# Patient Record
Sex: Female | Born: 1935 | Race: Black or African American | Hispanic: No | Smoking: Never smoker
Health system: Southern US, Community
[De-identification: ages and names within clinical notes are randomized; demographics above are authoritative.]

## PROBLEM LIST (undated history)

## (undated) DIAGNOSIS — F0280 Dementia in other diseases classified elsewhere without behavioral disturbance: Secondary | ICD-10-CM

## (undated) DIAGNOSIS — E119 Type 2 diabetes mellitus without complications: Secondary | ICD-10-CM

## (undated) DIAGNOSIS — I10 Essential (primary) hypertension: Secondary | ICD-10-CM

## (undated) DIAGNOSIS — C50919 Malignant neoplasm of unspecified site of unspecified female breast: Secondary | ICD-10-CM

## (undated) HISTORY — PX: MASTECTOMY: SHX3

---

## 2020-08-27 ENCOUNTER — Other Ambulatory Visit: Payer: Self-pay

## 2020-08-27 ENCOUNTER — Inpatient Hospital Stay (HOSPITAL_BASED_OUTPATIENT_CLINIC_OR_DEPARTMENT_OTHER)
Admission: EM | Admit: 2020-08-27 | Discharge: 2020-08-29 | DRG: 065 | Disposition: A | Discharge status: Home Health | Payer: Medicare HMO | Attending: Internal Medicine | Admitting: Internal Medicine

## 2020-08-27 ENCOUNTER — Emergency Department (HOSPITAL_COMMUNITY): Payer: Medicare HMO

## 2020-08-27 ENCOUNTER — Inpatient Hospital Stay (HOSPITAL_COMMUNITY): Payer: Medicare HMO

## 2020-08-27 ENCOUNTER — Emergency Department (HOSPITAL_BASED_OUTPATIENT_CLINIC_OR_DEPARTMENT_OTHER): Payer: Medicare HMO

## 2020-08-27 ENCOUNTER — Encounter (HOSPITAL_BASED_OUTPATIENT_CLINIC_OR_DEPARTMENT_OTHER): Payer: Self-pay | Admitting: Emergency Medicine

## 2020-08-27 DIAGNOSIS — I6322 Cerebral infarction due to unspecified occlusion or stenosis of basilar arteries: Secondary | ICD-10-CM | POA: Diagnosis not present

## 2020-08-27 DIAGNOSIS — N289 Disorder of kidney and ureter, unspecified: Secondary | ICD-10-CM | POA: Diagnosis present

## 2020-08-27 DIAGNOSIS — Z9010 Acquired absence of unspecified breast and nipple: Secondary | ICD-10-CM

## 2020-08-27 DIAGNOSIS — E785 Hyperlipidemia, unspecified: Secondary | ICD-10-CM | POA: Diagnosis present

## 2020-08-27 DIAGNOSIS — I70208 Unspecified atherosclerosis of native arteries of extremities, other extremity: Secondary | ICD-10-CM | POA: Diagnosis present

## 2020-08-27 DIAGNOSIS — N179 Acute kidney failure, unspecified: Secondary | ICD-10-CM | POA: Diagnosis present

## 2020-08-27 DIAGNOSIS — Z20822 Contact with and (suspected) exposure to covid-19: Secondary | ICD-10-CM | POA: Diagnosis present

## 2020-08-27 DIAGNOSIS — N390 Urinary tract infection, site not specified: Secondary | ICD-10-CM | POA: Diagnosis present

## 2020-08-27 DIAGNOSIS — I69354 Hemiplegia and hemiparesis following cerebral infarction affecting left non-dominant side: Secondary | ICD-10-CM

## 2020-08-27 DIAGNOSIS — I700 Atherosclerosis of aorta: Secondary | ICD-10-CM | POA: Diagnosis present

## 2020-08-27 DIAGNOSIS — R29701 NIHSS score 1: Secondary | ICD-10-CM | POA: Diagnosis present

## 2020-08-27 DIAGNOSIS — F0280 Dementia in other diseases classified elsewhere without behavioral disturbance: Secondary | ICD-10-CM | POA: Diagnosis not present

## 2020-08-27 DIAGNOSIS — R531 Weakness: Secondary | ICD-10-CM

## 2020-08-27 DIAGNOSIS — Z8249 Family history of ischemic heart disease and other diseases of the circulatory system: Secondary | ICD-10-CM

## 2020-08-27 DIAGNOSIS — Z79899 Other long term (current) drug therapy: Secondary | ICD-10-CM

## 2020-08-27 DIAGNOSIS — R278 Other lack of coordination: Secondary | ICD-10-CM | POA: Diagnosis present

## 2020-08-27 DIAGNOSIS — Z7984 Long term (current) use of oral hypoglycemic drugs: Secondary | ICD-10-CM

## 2020-08-27 DIAGNOSIS — I639 Cerebral infarction, unspecified: Secondary | ICD-10-CM | POA: Diagnosis present

## 2020-08-27 DIAGNOSIS — Z853 Personal history of malignant neoplasm of breast: Secondary | ICD-10-CM | POA: Diagnosis not present

## 2020-08-27 DIAGNOSIS — E119 Type 2 diabetes mellitus without complications: Secondary | ICD-10-CM

## 2020-08-27 DIAGNOSIS — I6389 Other cerebral infarction: Secondary | ICD-10-CM | POA: Diagnosis not present

## 2020-08-27 DIAGNOSIS — R29810 Facial weakness: Secondary | ICD-10-CM | POA: Diagnosis present

## 2020-08-27 DIAGNOSIS — E1122 Type 2 diabetes mellitus with diabetic chronic kidney disease: Secondary | ICD-10-CM | POA: Diagnosis not present

## 2020-08-27 DIAGNOSIS — G309 Alzheimer's disease, unspecified: Secondary | ICD-10-CM | POA: Diagnosis present

## 2020-08-27 DIAGNOSIS — I351 Nonrheumatic aortic (valve) insufficiency: Secondary | ICD-10-CM | POA: Diagnosis not present

## 2020-08-27 DIAGNOSIS — E1151 Type 2 diabetes mellitus with diabetic peripheral angiopathy without gangrene: Secondary | ICD-10-CM | POA: Diagnosis present

## 2020-08-27 DIAGNOSIS — I6381 Other cerebral infarction due to occlusion or stenosis of small artery: Secondary | ICD-10-CM | POA: Diagnosis not present

## 2020-08-27 DIAGNOSIS — I10 Essential (primary) hypertension: Secondary | ICD-10-CM | POA: Diagnosis present

## 2020-08-27 HISTORY — DX: Dementia in other diseases classified elsewhere, unspecified severity, without behavioral disturbance, psychotic disturbance, mood disturbance, and anxiety: F02.80

## 2020-08-27 HISTORY — DX: Essential (primary) hypertension: I10

## 2020-08-27 HISTORY — DX: Cerebral infarction, unspecified: I63.9

## 2020-08-27 HISTORY — DX: Type 2 diabetes mellitus without complications: E11.9

## 2020-08-27 HISTORY — DX: Malignant neoplasm of unspecified site of unspecified female breast: C50.919

## 2020-08-27 LAB — CBC
HCT: 37.1 % (ref 36.0–46.0)
Hemoglobin: 11.2 g/dL — ABNORMAL LOW (ref 12.0–15.0)
MCH: 23.3 pg — ABNORMAL LOW (ref 26.0–34.0)
MCHC: 30.2 g/dL (ref 30.0–36.0)
MCV: 77.1 fL — ABNORMAL LOW (ref 80.0–100.0)
Platelets: 244 10*3/uL (ref 150–400)
RBC: 4.81 MIL/uL (ref 3.87–5.11)
RDW: 15.1 % (ref 11.5–15.5)
WBC: 4 10*3/uL (ref 4.0–10.5)
nRBC: 0 % (ref 0.0–0.2)

## 2020-08-27 LAB — DIFFERENTIAL
Abs Immature Granulocytes: 0.01 10*3/uL (ref 0.00–0.07)
Basophils Absolute: 0 10*3/uL (ref 0.0–0.1)
Basophils Relative: 1 %
Eosinophils Absolute: 0 10*3/uL (ref 0.0–0.5)
Eosinophils Relative: 1 %
Immature Granulocytes: 0 %
Lymphocytes Relative: 23 %
Lymphs Abs: 0.9 10*3/uL (ref 0.7–4.0)
Monocytes Absolute: 0.2 10*3/uL (ref 0.1–1.0)
Monocytes Relative: 6 %
Neutro Abs: 2.8 10*3/uL (ref 1.7–7.7)
Neutrophils Relative %: 69 %

## 2020-08-27 LAB — URINALYSIS, ROUTINE W REFLEX MICROSCOPIC
Bilirubin Urine: NEGATIVE
Glucose, UA: 250 mg/dL — AB
Hgb urine dipstick: NEGATIVE
Ketones, ur: NEGATIVE mg/dL
Nitrite: NEGATIVE
Protein, ur: NEGATIVE mg/dL
Specific Gravity, Urine: 1.025 (ref 1.005–1.030)
pH: 5.5 (ref 5.0–8.0)

## 2020-08-27 LAB — RAPID URINE DRUG SCREEN, HOSP PERFORMED
Amphetamines: NOT DETECTED
Barbiturates: NOT DETECTED
Benzodiazepines: NOT DETECTED
Cocaine: NOT DETECTED
Opiates: NOT DETECTED
Tetrahydrocannabinol: NOT DETECTED

## 2020-08-27 LAB — COMPREHENSIVE METABOLIC PANEL
ALT: 11 U/L (ref 0–44)
AST: 17 U/L (ref 15–41)
Albumin: 3.8 g/dL (ref 3.5–5.0)
Alkaline Phosphatase: 77 U/L (ref 38–126)
Anion gap: 9 (ref 5–15)
BUN: 16 mg/dL (ref 8–23)
CO2: 25 mmol/L (ref 22–32)
Calcium: 9 mg/dL (ref 8.9–10.3)
Chloride: 102 mmol/L (ref 98–111)
Creatinine, Ser: 1.29 mg/dL — ABNORMAL HIGH (ref 0.44–1.00)
GFR calc Af Amer: 44 mL/min — ABNORMAL LOW (ref >60)
GFR calc non Af Amer: 38 mL/min — ABNORMAL LOW (ref >60)
Glucose, Bld: 228 mg/dL — ABNORMAL HIGH (ref 70–99)
Potassium: 4.7 mmol/L (ref 3.5–5.1)
Sodium: 136 mmol/L (ref 135–145)
Total Bilirubin: 0.8 mg/dL (ref 0.3–1.2)
Total Protein: 7.3 g/dL (ref 6.5–8.1)

## 2020-08-27 LAB — URINALYSIS, MICROSCOPIC (REFLEX)

## 2020-08-27 LAB — PROTIME-INR
INR: 1 (ref 0.8–1.2)
Prothrombin Time: 13.1 seconds (ref 11.4–15.2)

## 2020-08-27 LAB — CBG MONITORING, ED
Glucose-Capillary: 140 mg/dL — ABNORMAL HIGH (ref 70–99)
Glucose-Capillary: 215 mg/dL — ABNORMAL HIGH (ref 70–99)
Glucose-Capillary: 239 mg/dL — ABNORMAL HIGH (ref 70–99)

## 2020-08-27 LAB — SARS CORONAVIRUS 2 BY RT PCR (HOSPITAL ORDER, PERFORMED IN ~~LOC~~ HOSPITAL LAB): SARS Coronavirus 2: NEGATIVE

## 2020-08-27 LAB — ETHANOL: Alcohol, Ethyl (B): <10 mg/dL (ref <10)

## 2020-08-27 LAB — APTT: aPTT: 28 seconds (ref 24–36)

## 2020-08-27 MED ORDER — INSULIN ASPART 100 UNIT/ML ~~LOC~~ SOLN
0.0000 [IU] | SUBCUTANEOUS | Status: DC
  Administered 2020-08-27: 3 [IU] via SUBCUTANEOUS
  Administered 2020-08-27 – 2020-08-28 (×2): 1 [IU] via SUBCUTANEOUS
  Administered 2020-08-28: 2 [IU] via SUBCUTANEOUS
  Administered 2020-08-28: 1 [IU] via SUBCUTANEOUS
  Administered 2020-08-28: 2 [IU] via SUBCUTANEOUS
  Administered 2020-08-29: 1 [IU] via SUBCUTANEOUS
  Administered 2020-08-29: 2 [IU] via SUBCUTANEOUS
  Administered 2020-08-29: 1 [IU] via SUBCUTANEOUS
  Administered 2020-08-29: 2 [IU] via SUBCUTANEOUS

## 2020-08-27 MED ORDER — ACETAMINOPHEN 325 MG PO TABS: 650.0000 mg | ORAL_TABLET | ORAL | Status: DC | PRN

## 2020-08-27 MED ORDER — CEPHALEXIN 250 MG PO CAPS
500.0000 mg | ORAL_CAPSULE | Freq: Once | ORAL | Status: AC
  Administered 2020-08-27: 500 mg via ORAL
  Filled 2020-08-27: qty 2

## 2020-08-27 MED ORDER — STROKE: EARLY STAGES OF RECOVERY BOOK: Freq: Once | Status: AC

## 2020-08-27 MED ORDER — LORAZEPAM 2 MG/ML IJ SOLN
0.5000 mg | Freq: Once | INTRAMUSCULAR | Status: AC | PRN
  Administered 2020-08-27: 0.5 mg via INTRAVENOUS
  Filled 2020-08-27: qty 1

## 2020-08-27 MED ORDER — ACETAMINOPHEN 160 MG/5ML PO SOLN: 650.0000 mg | ORAL | Status: DC | PRN

## 2020-08-27 MED ORDER — ACETAMINOPHEN 650 MG RE SUPP: 650.0000 mg | RECTAL | Status: DC | PRN

## 2020-08-27 MED ORDER — ASPIRIN 300 MG RE SUPP: 300.0000 mg | Freq: Every day | RECTAL | Status: DC

## 2020-08-27 MED ORDER — ASPIRIN 325 MG PO TABS
325.0000 mg | ORAL_TABLET | Freq: Every day | ORAL | Status: DC
  Administered 2020-08-27 – 2020-08-28 (×2): 325 mg via ORAL
  Filled 2020-08-27 (×2): qty 1

## 2020-08-27 MED ORDER — SODIUM CHLORIDE 0.9 % IV SOLN: INTRAVENOUS | Status: DC

## 2020-08-27 NOTE — ED Provider Notes (Signed)
[name] Provider Note   CSN: [id number] Arrival date & time: 08/27/20  1056     History Chief Complaint  Patient presents with  . Facial Droop    Rita Douglas is a 86 y.o. female.  HPI      86yo female with history of dementia, DM, htn, breast cancer, presents with concern for left facial droop, left leg dragging.  Symptoms began 2 days ago and has stayed the same. Normally walks independently in 2 inch heels but having difficulty walking today. NO other concerns in terms of fall, fever, cough, congestion. Patient states she is not sure why she is here. History taken from daughter. She states face appears asymmetric, voice thick.  No other concerns.   Past Medical History:  Diagnosis Date  . Alzheimer's dementia ([place])   . Breast cancer ([place])   . Diabetes mellitus without complication ([place])   . Hypertension     Patient Active Problem List   Diagnosis Date Noted  . CVA (cerebral vascular accident) ([place]) 08/27/2020  . DM (diabetes mellitus), type 2 ([place]) 08/27/2020  . Essential hypertension 08/27/2020    Past Surgical History:  Procedure Laterality Date  . MASTECTOMY       OB History   No obstetric history on file.     No family history on file.  Social History   Tobacco Use  . Smoking status: Never Smoker  . Smokeless tobacco: Never Used  Substance Use Topics  . Alcohol use: Not on file  . Drug use: Not on file    Home Medications Prior to Admission medications   Medication Sig Start Date End Date Taking? Authorizing Provider  lisinopril (ZESTRIL) 10 MG tablet Take 10 mg by mouth daily. 08/27/20  Yes [provider]  metFORMIN (GLUCOPHAGE) 1000 MG tablet Take 1,000 mg by mouth 2 (two) times daily with a meal.  06/19/20  Yes [provider]    Allergies    Patient has no known allergies.  Review of Systems   Review of Systems  Constitutional: Negative for fever.  HENT: Negative for sore throat.    Eyes: Negative for visual disturbance.  Respiratory: Negative for cough and shortness of breath.   Cardiovascular: Negative for chest pain.  Gastrointestinal: Negative for abdominal pain, constipation, diarrhea, nausea and vomiting.  Genitourinary: Negative for difficulty urinating.  Musculoskeletal: Positive for gait problem. Negative for back pain and neck pain.  Skin: Negative for rash.  Neurological: Positive for facial asymmetry and weakness. Negative for dizziness, syncope, speech difficulty, numbness and headaches.    Physical Exam Updated Vital Signs BP (!) 176/96 (BP Location: Right Arm)   Pulse 64   Temp (!) 97.4 F (36.3 C) (Oral)   Resp 16   Ht 5\' 4"  (1.626 m)   Wt 58.1 kg   SpO2 100%   BMI 21.97 kg/m   Physical Exam Vitals and nursing note reviewed.  Constitutional:      General: She is not in acute distress.    Appearance: She is well-developed. She is not diaphoretic.  HENT:     Head: Normocephalic and atraumatic.  Eyes:     Conjunctiva/sclera: Conjunctivae normal.  Cardiovascular:     Rate and Rhythm: Normal rate and regular rhythm.     Heart sounds: Normal heart sounds. No murmur heard.  No friction rub. No gallop.   Pulmonary:     Effort: Pulmonary effort is normal. No respiratory distress.     Breath sounds: Normal breath  sounds. No wheezing or rales.  Abdominal:     General: There is no distension.     Palpations: Abdomen is soft.     Tenderness: There is no abdominal tenderness. There is no guarding.  Musculoskeletal:        General: No tenderness.     Cervical back: Normal range of motion.  Skin:    General: Skin is warm and dry.     Findings: No erythema or rash.  Neurological:     Mental Status: She is alert and oriented to person, place, and time.     Comments: Slight drift left arm, slight facial asymmetry with strength bilateral face however family reports left side drooping more than baseline, slight dysmetria vs weakness LUE, LLE and  RLE appears normal on exam.  Oriented to self, "[place]" (at [facility]), not aware of date or president     ED Results / Procedures / Treatments   Labs (all labs ordered are listed, but only abnormal results are displayed) Labs Reviewed  CBC - Abnormal; Notable for the following components:      Result Value   Hemoglobin 11.2 (*)    MCV 77.1 (*)    MCH 23.3 (*)    All other components within normal limits  COMPREHENSIVE METABOLIC PANEL - Abnormal; Notable for the following components:   Glucose, Bld 228 (*)    Creatinine, Ser 1.29 (*)    GFR calc non Af Amer 38 (*)    GFR calc Af Amer 44 (*)    All other components within normal limits  URINALYSIS, ROUTINE W REFLEX MICROSCOPIC - Abnormal; Notable for the following components:   Glucose, UA 250 (*)    Leukocytes,Ua SMALL (*)    All other components within normal limits  URINALYSIS, MICROSCOPIC (REFLEX) - Abnormal; Notable for the following components:   Bacteria, UA MANY (*)    All other components within normal limits  CBG MONITORING, ED - Abnormal; Notable for the following components:   Glucose-Capillary 215 (*)    All other components within normal limits  CBG MONITORING, ED - Abnormal; Notable for the following components:   Glucose-Capillary 239 (*)    All other components within normal limits  SARS CORONAVIRUS 2 BY RT PCR (HOSPITAL ORDER, [facility])  URINE CULTURE  PROTIME-INR  DIFFERENTIAL  ETHANOL  APTT  RAPID URINE DRUG SCREEN, HOSP PERFORMED  HEMOGLOBIN A1C  LIPID PANEL  HEMOGLOBIN A1C  LIPID PANEL  URINALYSIS, DIPSTICK ONLY    EKG EKG Interpretation  Date/Time:  Monday August 27 2020 11:32:20 EDT Ventricular Rate:  69 PR Interval:    QRS Duration: 91 QT Interval:  379 QTC Calculation: 406 R Axis:   -5 Text Interpretation: Sinus rhythm Consider left atrial enlargement Borderline T abnormalities, inferior leads No old tracing to compare Confirmed by Isla Pence  [phone]) on 08/27/2020 3:10:43 PM Also confirmed by Gareth Morgan [phone])  on 08/27/2020 9:13:31 PM   Radiology CT HEAD WO CONTRAST  Result Date: 08/27/2020 CLINICAL DATA:  Left-sided facial drooping and dragging left leg. Suspect stroke. EXAM: CT HEAD WITHOUT CONTRAST TECHNIQUE: Contiguous axial images were obtained from the base of the skull through the vertex without intravenous contrast. COMPARISON:  09/02/2016 FINDINGS: Brain: Mild cerebral atrophy is similar to the previous examination. No evidence for acute hemorrhage, mass lesion, midline shift, hydrocephalus or large infarct. Vascular: No hyperdense vessel or unexpected calcification. Skull: Normal. Negative for fracture or focal lesion. Sinuses/Orbits: No acute finding. Other:  None. IMPRESSION: 1. No acute intracranial abnormality. 2. Stable mild cerebral atrophy. Electronically Signed   By: Markus Daft M.D.   On: 08/27/2020 11:25   MR BRAIN WO CONTRAST  Result Date: 08/27/2020 CLINICAL DATA:  Stroke, follow-up. EXAM: MRI HEAD WITHOUT CONTRAST TECHNIQUE: Multiplanar, multiecho pulse sequences of the brain and surrounding structures were obtained without intravenous contrast. COMPARISON:  Noncontrast head CT 08/27/2020 FINDINGS: Brain: Moderate generalized parenchymal atrophy. There is a focus of restricted diffusion consistent with acute infarction within the right pons measuring 1.9 x 1.1 x 1.3 cm (series 3, image 18) (series 4, image 19). Corresponding T2/FLAIR hyperintensity at this site. Moderate patchy T2 hyperintensity within the pons consistent with chronic small vessel ischemic changes. Minimal multifocal T2/FLAIR hyperintensity within the cerebral white matter, nonspecific but also likely reflecting chronic small vessel ischemic disease. Small chronic right basal ganglia lacunar infarct (series 7, image 13). No evidence of intracranial mass. No chronic intracranial blood products. No extra-axial fluid collection. No midline shift.  Vascular: Expected proximal arterial flow voids. Skull and upper cervical spine: No focal marrow lesion. Sinuses/Orbits: Visualized orbits show no acute finding. Minimal ethmoid sinus mucosal thickening. No significant mastoid effusion IMPRESSION: Acute infarct within the right pons measuring 1.9 x 1.1 cm in transaxial dimensions. Background moderate chronic small vessel ischemic changes within the pons and mild chronic small vessel ischemic changes within the cerebral white matter. Small chronic right basal ganglia lacunar infarct. Moderate generalized parenchymal atrophy. Electronically Signed   By: Kellie Simmering DO   On: 08/27/2020 18:51    Procedures Procedures (including critical care time)  Medications Ordered in ED Medications  insulin aspart (novoLOG) injection 0-9 Units (has no administration in time range)   stroke: mapping our early stages of recovery book (has no administration in time range)  0.9 %  sodium chloride infusion (has no administration in time range)  acetaminophen (TYLENOL) tablet 650 mg (has no administration in time range)    Or  acetaminophen (TYLENOL) 160 MG/5ML solution 650 mg (has no administration in time range)    Or  acetaminophen (TYLENOL) suppository 650 mg (has no administration in time range)  aspirin suppository 300 mg (has no administration in time range)    Or  aspirin tablet 325 mg (has no administration in time range)  cephALEXin (KEFLEX) capsule 500 mg (500 mg Oral Given 08/27/20 1403)  LORazepam (ATIVAN) injection 0.5 mg (0.5 mg Intravenous Given 08/27/20 1406)    ED Course  I have reviewed the triage vital signs and the nursing notes.  Pertinent labs & imaging results that were available during my care of the patient were reviewed by me and considered in my medical decision making (see chart for details).    MDM Rules/Calculators/A&P                          86yo female with history of dementia, DM, htn, breast cancer, presents with concern  for left facial droop, left leg dragging.  History concerning for CVA 2 days ago, however exam with very mild abnormalities and in current climate would like to confirm possible CVA on MRI prior to admission.  Transferred to [facility] for MRI for further evaluation.  Given keflex for possible UTI.  Transferred for further care.   Final Clinical Impression(s) / ED Diagnoses Final diagnoses:  Stroke (cerebrum) ([place])  Left-sided weakness    Rx / DC Orders ED Discharge Orders    None  Gareth Morgan, MD 08/27/20 2128

## 2020-08-27 NOTE — ED Notes (Signed)
Pt transported to X-Ray by stretcher.

## 2020-08-27 NOTE — ED Triage Notes (Signed)
Family states they have noticed left sided facial drooping and dragging her left leg x 2 days.  Unsure if patient is taking her medications.

## 2020-08-27 NOTE — ED Triage Notes (Signed)
Pt transferred from [place] for MRI after she lost her dentures and her daughter "made" her go to hosp.  Pt states she always has a facial droop when she doesn't have her dentures in, but daughter does not agree.  NIH of 1 at [facility].

## 2020-08-27 NOTE — ED Notes (Signed)
Pt. explaines she has white coat syndrome and her B/P goes up when she is at Dr. Gabriel Carina or hospital

## 2020-08-27 NOTE — ED Provider Notes (Signed)
Pt transferred from [facility] for MRI brain.  She has no complaints.  She remains stable.     MRI:  IMPRESSION: Acute infarct within the right pons measuring 1.9 x 1.1 cm in transaxial dimensions.  Background moderate chronic small vessel ischemic changes within the pons and mild chronic small vessel ischemic changes within the cerebral white matter.  Small chronic right basal ganglia lacunar infarct.  Moderate generalized parenchymal atrophy.  Pt d/w Dr. Roel Cluck (triad) for admission.  Pt d/w Dr. Cheral Marker (neurology) who will see patient.  CRITICAL CARE Performed by: Isla Pence   Total critical care time: 30 minutes  Critical care time was exclusive of separately billable procedures and treating other patients.  Critical care was necessary to treat or prevent imminent or life-threatening deterioration.  Critical care was time spent personally by me on the following activities: development of treatment plan with patient and/or surrogate as well as nursing, discussions with consultants, evaluation of patient's response to treatment, examination of patient, obtaining history from patient or surrogate, ordering and performing treatments and interventions, ordering and review of laboratory studies, ordering and review of radiographic studies, pulse oximetry and re-evaluation of patient's condition.    Isla Pence, MD 08/27/20 2008

## 2020-08-27 NOTE — Consult Note (Signed)
NEURO HOSPITALIST CONSULT NOTE   Requestig physician: Dr. Roel Cluck  Reason for Consult: Acute right paramedian pontine stroke  History obtained from:  Patient and Chart     HPI:                                                                                                                                          Rita Douglas is an 86 y.o. female with a history of DM, HTN, breast cancer and Alzheimer's dementia who initially presented to [facility] with a 2 day history of left facial droop and LLE weakness manifesting as dragging of her left leg while ambulating. Exam by the EDP revealed slight drift of her LUE, mild dysmetria of her LUE and LLE and slight facial asymmetry. She was transferred to the [facility] ED for an MRI, which revealed an acute right paramedian pontine ischemic infarction.   She walks independently at baseline.   Past Medical History:  Diagnosis Date  . Alzheimer's dementia ([place])   . Breast cancer ([place])   . Diabetes mellitus without complication ([place])   . Hypertension     Past Surgical History:  Procedure Laterality Date  . MASTECTOMY      No family history on file.           Social History:  reports that she has never smoked. She has never used smokeless tobacco. No history on file for alcohol use and drug use.  No Known Allergies  HOME MEDICATIONS:                                                                                                                      No current facility-administered medications on file prior to encounter.   Current Outpatient Medications on File Prior to Encounter  Medication Sig Dispense Refill  . lisinopril (ZESTRIL) 10 MG tablet Take 10 mg by mouth daily.    . metFORMIN (GLUCOPHAGE) 1000 MG tablet Take 1,000 mg by mouth 2 (two) times daily with a meal.        ROS:  As per HPI.  Comprehensive ROS otherwise negative.    Blood pressure (!) 158/104, pulse 85, temperature (!) 97.4 F (36.3 C), temperature source Oral, resp. rate 15, height 5\' 4"  (1.626 m), weight 58.1 kg, SpO2 100 %.   General Examination:                                                                                                       Physical Exam  HEENT-  [place]/AT   Lungs- Respirations unlabored Extremities- No edema   Neurological Examination Mental Status: Awake and alert. Pleasant and cooperative. Oriented to the city and state, but not day, month or year. Speech is fluent with intact comprehension. Prosody intact. No dysarthria.   Cranial Nerves: II: Visual fields grossly intact with no extinction to DSS. PERRL.   III,IV, VI: No ptosis. EOMI. No nystagmus.   V,VII: Smile symmetric with no facial droop noted. Intermittent oral dyskinesia is noted. Facial temp sensation intact bilaterally VIII: hearing intact to voice IX,X: No hypophonia XI:  Slight lag on the left with shoulder shrug XII: Midline tongue extension Motor: RUE and RLE 5/5 LUE 4+/5 LLE 4+/5 No pronator drift noted.  Sensory: Temp and light touch intact and symmetric throughout, bilaterally. No extinction to DSS.  Deep Tendon Reflexes: 1+ right brachioradialis and biceps. Trace left brachioradialis and biceps. Trace right patellar, 0 left patellar.  Plantars: Right: downgoing   Left: downgoing Cerebellar: Mild ataxia with left FNF and H-S. Normal on the right.  Gait: Deferred   Lab Results: Basic Metabolic Panel: Recent Labs  Lab 08/27/20 1228  NA 136  K 4.7  CL 102  CO2 25  GLUCOSE 228*  BUN 16  CREATININE 1.29*  CALCIUM 9.0    CBC: Recent Labs  Lab 08/27/20 1228  WBC 4.0  NEUTROABS 2.8  HGB 11.2*  HCT 37.1  MCV 77.1*  PLT 244    Cardiac Enzymes: No results for input(s): CKTOTAL, CKMB, CKMBINDEX, TROPONINI in the last 168 hours.  Lipid Panel: No results for input(s): CHOL, TRIG, HDL,  CHOLHDL, VLDL, LDLCALC in the last 168 hours.  Imaging: DG Chest 2 View  Result Date: 08/27/2020 CLINICAL DATA:  Facial droop. EXAM: CHEST - 2 VIEW COMPARISON:  September 02, 2016 FINDINGS: Mildly decreased lung volumes are seen which is likely secondary to the degree of patient inspiration. There is no evidence of acute infiltrate, pleural effusion or pneumothorax. There is mild to moderate severity enlargement of the cardiac silhouette. Marked severity calcification of the aortic arch is seen. Moderate severity degenerative changes seen involving the left shoulder. IMPRESSION: No acute or active cardiopulmonary disease. Electronically Signed   By: Virgina Norfolk M.D.   On: 08/27/2020 21:25   CT HEAD WO CONTRAST  Result Date: 08/27/2020 CLINICAL DATA:  Left-sided facial drooping and dragging left leg. Suspect stroke. EXAM: CT HEAD WITHOUT CONTRAST TECHNIQUE: Contiguous axial images were obtained from the base of the skull through the vertex without intravenous contrast. COMPARISON:  09/02/2016 FINDINGS: Brain: Mild cerebral atrophy is similar to the previous examination. No evidence for acute hemorrhage,  mass lesion, midline shift, hydrocephalus or large infarct. Vascular: No hyperdense vessel or unexpected calcification. Skull: Normal. Negative for fracture or focal lesion. Sinuses/Orbits: No acute finding. Other: None. IMPRESSION: 1. No acute intracranial abnormality. 2. Stable mild cerebral atrophy. Electronically Signed   By: Markus Daft M.D.   On: 08/27/2020 11:25   MR BRAIN WO CONTRAST  Result Date: 08/27/2020 CLINICAL DATA:  Stroke, follow-up. EXAM: MRI HEAD WITHOUT CONTRAST TECHNIQUE: Multiplanar, multiecho pulse sequences of the brain and surrounding structures were obtained without intravenous contrast. COMPARISON:  Noncontrast head CT 08/27/2020 FINDINGS: Brain: Moderate generalized parenchymal atrophy. There is a focus of restricted diffusion consistent with acute infarction within the  right pons measuring 1.9 x 1.1 x 1.3 cm (series 3, image 18) (series 4, image 19). Corresponding T2/FLAIR hyperintensity at this site. Moderate patchy T2 hyperintensity within the pons consistent with chronic small vessel ischemic changes. Minimal multifocal T2/FLAIR hyperintensity within the cerebral white matter, nonspecific but also likely reflecting chronic small vessel ischemic disease. Small chronic right basal ganglia lacunar infarct (series 7, image 13). No evidence of intracranial mass. No chronic intracranial blood products. No extra-axial fluid collection. No midline shift. Vascular: Expected proximal arterial flow voids. Skull and upper cervical spine: No focal marrow lesion. Sinuses/Orbits: Visualized orbits show no acute finding. Minimal ethmoid sinus mucosal thickening. No significant mastoid effusion IMPRESSION: Acute infarct within the right pons measuring 1.9 x 1.1 cm in transaxial dimensions. Background moderate chronic small vessel ischemic changes within the pons and mild chronic small vessel ischemic changes within the cerebral white matter. Small chronic right basal ganglia lacunar infarct. Moderate generalized parenchymal atrophy. Electronically Signed   By: Kellie Simmering DO   On: 08/27/2020 18:51    Assessment: 86 year old female with acute right paramedian pontine ischemic infarction. 1. Exam reveals mild left sided ataxia and subtle LUE and LLE weakness.  2. MRI brain reveals an acute infarct within the right pons measuring 1.9 x 1.1 cm in transaxial dimensions. There are moderate chronic small vessel ischemic changes within the pons and mild chronic small vessel ischemic changes within the cerebral white matter. A small chronic right basal ganglia lacunar infarct is also noted. Moderate generalized parenchymal atrophy is present. 3. Stroke risk factors: DM and HTN  Recommendations: 1. HgbA1c, fasting lipid panel 2. MRA of the brain without contrast 3. Carotid ultrasound 4.  TTE 5. Benefits of statin therapy may be outweighed by risks given the patient's advanced age 84. Prophylactic therapy- Has been started on ASA 7. Risk factor modification 8. Telemetry monitoring 9. Frequent neuro checks 10. NPO until passes stroke swallow screen 11. BP management. Out of permissive HTN time window.     Electronically signed: Dr. Kerney Elbe  08/27/2020, 9:58 PM

## 2020-08-27 NOTE — ED Notes (Signed)
The Pt. Explains to RN she has no facial droop on the L side.  Pt. Said her teeth keep her face from looking that way and her daughter thinks she has this going on.

## 2020-08-27 NOTE — H&P (Addendum)
Rita Douglas HEN:[id number] DOB: 04-06-1934 DOA: 08/27/2020     PCP: Iva Lento, PA-C   Outpatient Specialists:   NONE   Patient arrived to ER on 08/27/20 at 1056 Referred by Attending Isla Pence, MD   Patient coming from: home    Chief Complaint:  Chief Complaint  Patient presents with   Facial Droop    HPI: Rita Douglas is a 86 y.o. female with medical history significant of Dm2, HTN history of breast cancer    Presented with left facial droop and dragging her left leg for the past 2 days patient states is since she took out her dentures and she always walks with dura does not think that.  Her daughter took her to [place].  From there she was transferred to [facility], ER for possible CVA work-up.  Patient has dementia at baseline but stable she has diabetes on p.o. medications At her baseline patient walks independently but have been having some difficulty walking today. No prior history of CVA  Infectious risk factors:  Reports none    Initial COVID TEST  NEGATIVE   Lab Results  Component Value Date   [place] NEGATIVE 08/27/2020     Regarding pertinent Chronic problems:     HTN on lisinopril  DM 2 - on PO meds only,   Dementia - not on any meds   Chronic anemia - baseline hg Hemoglobin & Hematocrit  Recent Labs    08/27/20 1228  HGB 11.2*    While in ER:  MRI showing pontine CVA  ER Provider Called:  Neurology   Dr.LIndzen They Recommend admit to medicine  Will see    in ER  Hospitalist was called for admission for  CVA  The following Work up has been ordered so far:  Orders Placed This Encounter  Procedures   SARS Coronavirus 2 by RT PCR (hospital order, performed in [facility] hospital lab) Nasopharyngeal Nasopharyngeal Swab   CT HEAD WO CONTRAST   MR BRAIN WO CONTRAST   Protime-INR   CBC   Differential   Comprehensive metabolic panel   Ethanol   APTT   Urine rapid drug screen (hosp performed)    Urinalysis, Routine w reflex microscopic   Urinalysis, Microscopic (reflex)   Diet heart healthy/carb modified Room service appropriate? Yes; Fluid consistency: Thin   Cardiac monitoring   Stroke swallow screen   NIH Stroke Scale   Modified Stroke Scale (mNIHSS) Document mNIHSS assessment every 2 hours for a total of 12 hours   Saline Lock IV, Maintain IV access   If O2 Sat <94% administer O2 at 2 liters/minute via nasal cannula   Vital signs   Initiate [place] Fluid Protocol   Consult to neurology  ALL PATIENTS BEING ADMITTED/HAVING PROCEDURES NEED COVID-19 SCREENING   Consult for Unassigned Medical Admission  ALL PATIENTS BEING ADMITTED/HAVING PROCEDURES NEED COVID-19 SCREENING   Pulse oximetry, continuous   CBG monitoring, ED   ED EKG   Saline lock IV   Following Medications were ordered in ER: Medications  cephALEXin (KEFLEX) capsule 500 mg (500 mg Oral Given 08/27/20 1403)  LORazepam (ATIVAN) injection 0.5 mg (0.5 mg Intravenous Given 08/27/20 1406)       Significant initial  Findings: Abnormal Labs Reviewed  CBC - Abnormal; Notable for the following components:      Result Value   Hemoglobin 11.2 (*)    MCV 77.1 (*)    MCH 23.3 (*)    All other components  within normal limits  COMPREHENSIVE METABOLIC PANEL - Abnormal; Notable for the following components:   Glucose, Bld 228 (*)    Creatinine, Ser 1.29 (*)    GFR calc non Af Amer 38 (*)    GFR calc Af Amer 44 (*)    All other components within normal limits  URINALYSIS, ROUTINE W REFLEX MICROSCOPIC - Abnormal; Notable for the following components:   Glucose, UA 250 (*)    Leukocytes,Ua SMALL (*)    All other components within normal limits  URINALYSIS, MICROSCOPIC (REFLEX) - Abnormal; Notable for the following components:   Bacteria, UA MANY (*)    All other components within normal limits  CBG MONITORING, ED - Abnormal; Notable for the following components:   Glucose-Capillary 215 (*)    All other  components within normal limits    Otherwise labs showing:    Recent Labs  Lab 08/27/20 1228  NA 136  K 4.7  CO2 25  GLUCOSE 228*  BUN 16  CREATININE 1.29*  CALCIUM 9.0    Cr     Up from baseline see below Lab Results  Component Value Date   CREATININE 1.29 (H) 08/27/2020    Recent Labs  Lab 08/27/20 1228  AST 17  ALT 11  ALKPHOS 77  BILITOT 0.8  PROT 7.3  ALBUMIN 3.8   Lab Results  Component Value Date   CALCIUM 9.0 08/27/2020     WBC       Component Value Date/Time   WBC 4.0 08/27/2020 1228   LYMPHSABS 0.9 08/27/2020 1228   MONOABS 0.2 08/27/2020 1228   EOSABS 0.0 08/27/2020 1228   BASOSABS 0.0 08/27/2020 1228    Plt: Lab Results  Component Value Date   PLT 244 08/27/2020    HG/HCT  Stable,     Component Value Date/Time   HGB 11.2 (L) 08/27/2020 1228   HCT 37.1 08/27/2020 1228   MCV 77.1 (L) 08/27/2020 1228        ECG: Ordered Personally reviewed by me showing: HR :69 Rhythm: NSR,   , no evidence of ischemic changes QTC 409   CBG (last 3)  Recent Labs    08/27/20 1243  GLUCAP 215*     UA   evidence of UTI      Urine analysis:    Component Value Date/Time   COLORURINE YELLOW 08/27/2020 1228   APPEARANCEUR CLEAR 08/27/2020 1228   LABSPEC 1.025 08/27/2020 1228   PHURINE 5.5 08/27/2020 1228   GLUCOSEU 250 (A) 08/27/2020 1228   HGBUR NEGATIVE 08/27/2020 1228   BILIRUBINUR NEGATIVE 08/27/2020 [place] 08/27/2020 1228   PROTEINUR NEGATIVE 08/27/2020 1228   NITRITE NEGATIVE 08/27/2020 1228   LEUKOCYTESUR SMALL (A) 08/27/2020 1228       Ordered  CT HEAD  NON acute  CXR - NON acute  MRi brain - Acute infarct within the right pons measuring 1.9 x 1.1 cm in transaxial dimensions.   ED Triage Vitals  Enc Vitals Group     BP 08/27/20 1121 (!) 173/93     Pulse Rate 08/27/20 1121 75     Resp 08/27/20 1121 16     Temp 08/27/20 1121 97.9 F (36.6 C)     Temp Source 08/27/20 1121 Oral     SpO2 08/27/20 1121  100 %     Weight 08/27/20 1539 128 lb (58.1 kg)     Height 08/27/20 1539 5\' 4"  (6.010 m)     Head Circumference --  Peak Flow --      Pain Score 08/27/20 1106 0     Pain Loc --      Pain Edu? --      Excl. in [place]? --   TMAX(24)@      Latest  Blood pressure (!) 176/96, pulse 64, temperature (!) 97.4 F (36.3 C), temperature source Oral, resp. rate 16, height 5\' 4"  (1.626 m), weight 58.1 kg, SpO2 100 %.    Review of Systems:    Pertinent positives include:  localizing neurological complaints,  Constitutional:  No weight loss, night sweats, Fevers, chills, fatigue, weight loss  HEENT:  No headaches, Difficulty swallowing,Tooth/dental problems,Sore throat,  No sneezing, itching, ear ache, nasal congestion, post nasal drip,  Cardio-vascular:  No chest pain, Orthopnea, PND, anasarca, dizziness, palpitations.no Bilateral lower extremity swelling  GI:  No heartburn, indigestion, abdominal pain, nausea, vomiting, diarrhea, change in bowel habits, loss of appetite, melena, blood in stool, hematemesis Resp:  no shortness of breath at rest. No dyspnea on exertion, No excess mucus, no productive cough, No non-productive cough, No coughing up of blood.No change in color of mucus.No wheezing. Skin:  no rash or lesions. No jaundice GU:  no dysuria, change in color of urine, no urgency or frequency. No straining to urinate.  No flank pain.  Musculoskeletal:  No joint pain or no joint swelling. No decreased range of motion. No back pain.  Psych:  No change in mood or affect. No depression or anxiety. No memory loss.  Neuro: no no tingling, no weakness, no double vision, no gait abnormality, no slurred speech, no confusion  All systems reviewed and apart from [place] all are negative  Past Medical History:   Past Medical History:  Diagnosis Date   Alzheimer's dementia ([place])    Breast cancer ([place])    Diabetes mellitus without complication ([place])    Hypertension       Past Surgical  History:  Procedure Laterality Date   MASTECTOMY      Social History:  Ambulatory  independently     reports that she has never smoked. She has never used smokeless tobacco. No history on file for alcohol use and drug use.   Family History:   Family History  Problem Relation Age of Onset   Hypertension Other     Allergies: No Known Allergies   Prior to Admission medications   Medication Sig Start Date End Date Taking? Authorizing Provider  lisinopril (ZESTRIL) 10 MG tablet Take 10 mg by mouth daily. 08/27/20  Yes [provider]  metFORMIN (GLUCOPHAGE) 1000 MG tablet Take 1,000 mg by mouth 2 (two) times daily with a meal.  06/19/20  Yes [provider]   Physical Exam: Vitals with BMI 08/27/2020 08/27/2020 08/27/2020  Height 5\' 4"  - -  Weight 128 lbs - -  BMI 25.85 - -  Systolic - 277 824  Diastolic - 96 235  Pulse - 64 69    1. General:  in No  Acute distress    Chronically ill  -appearing 2. Psychological: Alert and  Oriented 3. Head/ENT:     Dry Mucous Membranes                          Head Non traumatic, neck supple                          Poor Dentition 4. SKIN:  decreased Skin turgor,  Skin clean Dry and intact no rash 5. Heart: Regular rate and rhythm no  Murmur, no Rub or gallop 6. Lungs:  Clear to auscultation bilaterally, no wheezes or crackles   7. Abdomen: Soft, non-tender, Non distended bowel sounds present 8. Lower extremities: no clubbing, cyanosis, no  edema 9. Neurologically slight diminished strength on the left with left facial droop 10. MSK: Normal range of motion   All other LABS:     Recent Labs  Lab 08/27/20 1228  WBC 4.0  NEUTROABS 2.8  HGB 11.2*  HCT 37.1  MCV 77.1*  PLT 244     Recent Labs  Lab 08/27/20 1228  NA 136  K 4.7  CL 102  CO2 25  GLUCOSE 228*  BUN 16  CREATININE 1.29*  CALCIUM 9.0     Recent Labs  Lab 08/27/20 1228  AST 17  ALT 11  ALKPHOS 77  BILITOT 0.8  PROT 7.3  ALBUMIN 3.8        Cultures: No results found for: SDES, SPECREQUEST, CULT, REPTSTATUS   Radiological Exams on Admission: CT HEAD WO CONTRAST  Result Date: 08/27/2020 CLINICAL DATA:  Left-sided facial drooping and dragging left leg. Suspect stroke. EXAM: CT HEAD WITHOUT CONTRAST TECHNIQUE: Contiguous axial images were obtained from the base of the skull through the vertex without intravenous contrast. COMPARISON:  09/02/2016 FINDINGS: Brain: Mild cerebral atrophy is similar to the previous examination. No evidence for acute hemorrhage, mass lesion, midline shift, hydrocephalus or large infarct. Vascular: No hyperdense vessel or unexpected calcification. Skull: Normal. Negative for fracture or focal lesion. Sinuses/Orbits: No acute finding. Other: None. IMPRESSION: 1. No acute intracranial abnormality. 2. Stable mild cerebral atrophy. Electronically Signed   By: Markus Daft M.D.   On: 08/27/2020 11:25   MR BRAIN WO CONTRAST  Result Date: 08/27/2020 CLINICAL DATA:  Stroke, follow-up. EXAM: MRI HEAD WITHOUT CONTRAST TECHNIQUE: Multiplanar, multiecho pulse sequences of the brain and surrounding structures were obtained without intravenous contrast. COMPARISON:  Noncontrast head CT 08/27/2020 FINDINGS: Brain: Moderate generalized parenchymal atrophy. There is a focus of restricted diffusion consistent with acute infarction within the right pons measuring 1.9 x 1.1 x 1.3 cm (series 3, image 18) (series 4, image 19). Corresponding T2/FLAIR hyperintensity at this site. Moderate patchy T2 hyperintensity within the pons consistent with chronic small vessel ischemic changes. Minimal multifocal T2/FLAIR hyperintensity within the cerebral white matter, nonspecific but also likely reflecting chronic small vessel ischemic disease. Small chronic right basal ganglia lacunar infarct (series 7, image 13). No evidence of intracranial mass. No chronic intracranial blood products. No extra-axial fluid collection. No midline shift.  Vascular: Expected proximal arterial flow voids. Skull and upper cervical spine: No focal marrow lesion. Sinuses/Orbits: Visualized orbits show no acute finding. Minimal ethmoid sinus mucosal thickening. No significant mastoid effusion IMPRESSION: Acute infarct within the right pons measuring 1.9 x 1.1 cm in transaxial dimensions. Background moderate chronic small vessel ischemic changes within the pons and mild chronic small vessel ischemic changes within the cerebral white matter. Small chronic right basal ganglia lacunar infarct. Moderate generalized parenchymal atrophy. Electronically Signed   By: Kellie Simmering DO   On: 08/27/2020 18:51    Chart has been reviewed    Assessment/Plan  86 y.o. female with medical history significant of Dm2, HTN history of breast cancer  Admitted for CVA  Present on Admission:  CVA (cerebral vascular accident) ([place]) - - will admit based on TIA/CVA protocol,         Monitor on Tele  MRI   Resulted - showing acute ischemic CVA          Carotid Doppler ordered       Echo to evaluate for possible embolic source,        obtain cardiac enzymes,  ECG,   Lipid panel, TSH.        Order PT/OT evaluation.        keep nothing by mouth until passes swallow eval         Will make sure patient is on antiplatelet ASA  325 and statin        Allow permissive Hypertension keep BP <220/120        Neurology consulted Have seen pt in ER   Suspect UTI obtain urine culture and start no Rocephin.  HTN -allow permissive hypertension  DM2 -  - Order Sensitive SSI   -  check TSH and HgA1C  - Hold by mouth medications   AKI - vs CKD -obtain urine electrolytes gently rehydrate and follow avoid nephrotoxic medication  Other plan as per orders.  DVT prophylaxis:  SCD     Code Status:    Code Status: Not on file FULL CODE  as per patient   I had personally discussed CODE STATUS with patient     Family Communication:   Family not at  Bedside   Disposition Plan:  To  home once workup is complete and patient is stable   Following barriers for discharge:                                                    Will need consultants to evaluate patient prior to discharge                  Would benefit from PT/OT eval prior to DC  Ordered                                       Transition of care consulted                                      Behavioral health  consulted                    Consults called:    Neurology   Admission status:  ED Disposition    ED Disposition Condition [place]: [place] [[id number]]  Level of Care: Telemetry Surgical [[id number]]  May admit patient to [facility] or [facility] if equivalent level of care is available:: No  Covid Evaluation: Symptomatic Person Under Investigation (PUI)  Diagnosis: CVA (cerebral vascular accident) [facility]) [[id number]]  Admitting Physician: Toy Baker [[id number]]  Attending Physician: Toy Baker [[id number]]  Estimated length of stay: past midnight tomorrow  Certification:: I certify this patient will need inpatient services for at least 2 midnights          inpatient     I Expect 2 midnight stay secondary to severity of patient's current illness need for inpatient interventions justified by the following:    severe lab/radiological/exam abnormalities including:    new CVA and extensive comorbidities including:  DM2     dementia      malignancy,     That are currently affecting medical management.   I expect  patient to be hospitalized for 2 midnights requiring inpatient medical care.  Patient is at high risk for adverse outcome (such as loss of life or disability) if not treated.  Indication for inpatient stay as follows:   new stroke with new neuro deficits inability to maintain oral hydration     Need for  IV fluids,     Level of care   Tele  indefinitely please discontinue once patient no longer qualifies COVID-19 Labs    Lab  Results  Component Value Date   [place] 08/27/2020     Precautions: admitted as  Covid Negative     PPE: Used by the provider:    P100  eye Goggles,  Gloves    Laken Lobato 08/28/2020, 12:55 AM    [facility]     after 2 AM please page floor coverage PA If 7AM-7PM, please contact the day team taking care of the patient using [email or web address]   Patient was evaluated in the context of the global COVID-19 pandemic, which necessitated consideration that the patient might be at risk for infection with the SARS-CoV-2 virus that causes COVID-19. Institutional protocols and algorithms that pertain to the evaluation of patients at risk for COVID-19 are in a state of rapid change based on information released by regulatory bodies including the CDC and federal and state organizations. These policies and algorithms were followed during the patient's care.

## 2020-08-28 ENCOUNTER — Inpatient Hospital Stay (HOSPITAL_COMMUNITY): Payer: Medicare HMO

## 2020-08-28 ENCOUNTER — Encounter (HOSPITAL_COMMUNITY): Payer: Self-pay | Admitting: Internal Medicine

## 2020-08-28 DIAGNOSIS — G309 Alzheimer's disease, unspecified: Secondary | ICD-10-CM

## 2020-08-28 DIAGNOSIS — N179 Acute kidney failure, unspecified: Secondary | ICD-10-CM | POA: Diagnosis present

## 2020-08-28 DIAGNOSIS — I6322 Cerebral infarction due to unspecified occlusion or stenosis of basilar arteries: Secondary | ICD-10-CM

## 2020-08-28 DIAGNOSIS — I700 Atherosclerosis of aorta: Secondary | ICD-10-CM

## 2020-08-28 DIAGNOSIS — F0280 Dementia in other diseases classified elsewhere without behavioral disturbance: Secondary | ICD-10-CM

## 2020-08-28 DIAGNOSIS — N390 Urinary tract infection, site not specified: Secondary | ICD-10-CM | POA: Diagnosis present

## 2020-08-28 DIAGNOSIS — E119 Type 2 diabetes mellitus without complications: Secondary | ICD-10-CM

## 2020-08-28 DIAGNOSIS — I351 Nonrheumatic aortic (valve) insufficiency: Secondary | ICD-10-CM

## 2020-08-28 DIAGNOSIS — I6389 Other cerebral infarction: Secondary | ICD-10-CM

## 2020-08-28 HISTORY — DX: Atherosclerosis of aorta: I70.0

## 2020-08-28 LAB — GLUCOSE, CAPILLARY
Glucose-Capillary: 171 mg/dL — ABNORMAL HIGH (ref 70–99)
Glucose-Capillary: 185 mg/dL — ABNORMAL HIGH (ref 70–99)

## 2020-08-28 LAB — LIPID PANEL
Cholesterol: 264 mg/dL — ABNORMAL HIGH (ref 0–200)
Cholesterol: 271 mg/dL — ABNORMAL HIGH (ref 0–200)
HDL: 42 mg/dL (ref >40)
HDL: 43 mg/dL (ref >40)
LDL Cholesterol: 191 mg/dL — ABNORMAL HIGH (ref 0–99)
LDL Cholesterol: 196 mg/dL — ABNORMAL HIGH (ref 0–99)
Total CHOL/HDL Ratio: 6.3 RATIO
Total CHOL/HDL Ratio: 6.3 RATIO
Triglycerides: 157 mg/dL — ABNORMAL HIGH (ref <150)
Triglycerides: 160 mg/dL — ABNORMAL HIGH (ref <150)
VLDL: 31 mg/dL (ref 0–40)
VLDL: 32 mg/dL (ref 0–40)

## 2020-08-28 LAB — ECHOCARDIOGRAM COMPLETE
Area-P 1/2: 1.68 cm2
Height: 64 in
S' Lateral: 1.9 cm
Weight: 2048 oz

## 2020-08-28 LAB — CBG MONITORING, ED
Glucose-Capillary: 115 mg/dL — ABNORMAL HIGH (ref 70–99)
Glucose-Capillary: 134 mg/dL — ABNORMAL HIGH (ref 70–99)
Glucose-Capillary: 147 mg/dL — ABNORMAL HIGH (ref 70–99)
Glucose-Capillary: 171 mg/dL — ABNORMAL HIGH (ref 70–99)

## 2020-08-28 LAB — URINALYSIS, DIPSTICK ONLY
Bilirubin Urine: NEGATIVE
Glucose, UA: 150 mg/dL — AB
Hgb urine dipstick: NEGATIVE
Ketones, ur: NEGATIVE mg/dL
Leukocytes,Ua: NEGATIVE
Nitrite: NEGATIVE
Protein, ur: NEGATIVE mg/dL
Specific Gravity, Urine: 1.009 (ref 1.005–1.030)
pH: 5 (ref 5.0–8.0)

## 2020-08-28 LAB — URINE CULTURE

## 2020-08-28 LAB — CREATININE, URINE, RANDOM: Creatinine, Urine: 58.48 mg/dL

## 2020-08-28 LAB — HEMOGLOBIN A1C
Hgb A1c MFr Bld: 8.6 % — ABNORMAL HIGH (ref 4.8–5.6)
Mean Plasma Glucose: 200.12 mg/dL

## 2020-08-28 LAB — SODIUM, URINE, RANDOM: Sodium, Ur: 81 mmol/L

## 2020-08-28 MED ORDER — IOHEXOL 350 MG/ML SOLN
50.0000 mL | Freq: Once | INTRAVENOUS | Status: AC | PRN
  Administered 2020-08-28: 50 mL via INTRAVENOUS

## 2020-08-28 MED ORDER — CLOPIDOGREL BISULFATE 75 MG PO TABS
75.0000 mg | ORAL_TABLET | Freq: Every day | ORAL | Status: DC
  Administered 2020-08-28 – 2020-08-29 (×2): 75 mg via ORAL
  Filled 2020-08-28 (×2): qty 1

## 2020-08-28 MED ORDER — SODIUM CHLORIDE 0.9 % IV SOLN
1.0000 g | INTRAVENOUS | Status: DC
  Administered 2020-08-28: 1 g via INTRAVENOUS
  Filled 2020-08-28: qty 10

## 2020-08-28 MED ORDER — ATORVASTATIN CALCIUM 80 MG PO TABS
80.0000 mg | ORAL_TABLET | Freq: Every day | ORAL | Status: DC
  Administered 2020-08-28 – 2020-08-29 (×2): 80 mg via ORAL
  Filled 2020-08-28 (×2): qty 1

## 2020-08-28 MED ORDER — ASPIRIN EC 325 MG PO TBEC
325.0000 mg | DELAYED_RELEASE_TABLET | Freq: Every day | ORAL | Status: DC
  Administered 2020-08-29: 325 mg via ORAL
  Filled 2020-08-28: qty 1

## 2020-08-28 MED ORDER — SODIUM CHLORIDE 0.9 % IV SOLN: INTRAVENOUS | Status: DC

## 2020-08-28 NOTE — ED Notes (Signed)
Pt transported to vascular.  °

## 2020-08-28 NOTE — Assessment & Plan Note (Signed)
--  Stable.  Permissive hypertension

## 2020-08-28 NOTE — Progress Notes (Signed)
Echocardiogram 2D Echocardiogram has been performed.  Oneal Deputy Levante Simones 08/28/2020, 9:58 AM

## 2020-08-28 NOTE — Progress Notes (Addendum)
PROGRESS NOTE  Rita Douglas ZOX:[id number] DOB: 1934/05/11 DOA: 08/27/2020 PCP: Iva Lento, PA-C  Brief History   86 year old woman PMH diabetes mellitus type 2, breast cancer presented with left facial droop, left leg weakness for 2 days.  Admitted for acute CVA, neurology evaluation.   A & P  CVA (cerebral vascular accident) ([place]) --right pontine ischemic infarct w/ ataxia and subtle LUE/LLE weakness on admit --management per stroke service: ASA, Plavix x3 months, then ASA alone, statin added, no further workup planned --echo reassuring --therapy evals pending --f/u w/ stroke clinic in 4 weeks  Essential hypertension --Stable.  Permissive hypertension --can resume lisinopril soon  DM (diabetes mellitus), type 2 ([place]) --stable here but Hgb A1c 8.6 --can resume metformin on d/c  Renal insufficiency --not clear if AKI or CKD --IVF and check BMP in AM --continue to hold lisinopril for now  Aortic atherosclerosis ([place]) --starting on statin  Alzheimer's dementia --appears stable, not on tx  Abnormal urinalysis no clear urinary symptoms Suspect asymptomatic bacturia, will stop abx and monitor  Disposition Plan:  Discussion: Acute stroke.  Therapy eval is pending, otherwise work-up complete with neurology evaluation on file.  Anticipate discharge 9/22.  Status is: Inpatient  Remains inpatient appropriate because:Inpatient level of care appropriate due to severity of illness   Dispo: The patient is from: Home              Anticipated d/c is to: Home              Anticipated d/c date is: 1 day              Patient currently is not medically stable to d/c.  DVT prophylaxis: SCD's Start: 08/27/20 2028   Code Status: Full Code Family Communication: updated daughter by telephone, she agreed w/ plan   Murray Hodgkins, MD  [facility] Direct contact: see [email or web address] (further directions at bottom of note if needed) 7PM-7AM contact night coverage as at bottom of  note 08/28/2020, 5:14 PM  LOS: 1 day    Consults:  . Neurology    Interval History/Subjective  CC: f/u weakness  Feels better, no complaints, strength seems better  Objective   Vitals:  Vitals:   08/28/20 1636 08/28/20 1702  BP:  (!) 182/77  Pulse:  66  Resp:  20  Temp: 97.7 F (36.5 C) 97.6 F (36.4 C)  SpO2:  100%    Exam:  Physical Exam Constitutional:      General: She is not in acute distress.    Appearance: Normal appearance.  Cardiovascular:     Rate and Rhythm: Normal rate and regular rhythm.     Heart sounds: Normal heart sounds. No murmur heard.  No friction rub. No gallop.   Pulmonary:     Effort: Pulmonary effort is normal. No respiratory distress.     Breath sounds: Normal breath sounds. No wheezing, rhonchi or rales.  Musculoskeletal:     Right lower leg: No edema.     Left lower leg: No edema.  Neurological:     General: No focal deficit present.     Mental Status: She is alert.     Cranial Nerves: No cranial nerve deficit.     Motor: No weakness.  Psychiatric:        Mood and Affect: Mood normal.        Behavior: Behavior normal.      I have personally reviewed the following:   Today's Data  . CBG stable . Creatinine 1.29  on admit, suspect CKD, no previous values  Scheduled Meds: .  stroke: mapping our early stages of recovery book   Does not apply Once  . [START ON 08/29/2020] aspirin EC  325 mg Oral Daily  . atorvastatin  80 mg Oral Daily  . clopidogrel  75 mg Oral Daily  . insulin aspart  0-9 Units Subcutaneous Q4H   Continuous Infusions: . sodium chloride      Principal Problem:   CVA (cerebral vascular accident) ([place]) Active Problems:   DM (diabetes mellitus), type 2 ([place])   Essential hypertension   Alzheimer's dementia ([place])   AKI (acute kidney injury) ([place])   Acute lower UTI   Aortic atherosclerosis ([place])   LOS: 1 day   How to contact the [facility] Attending or Consulting provider 7A - 7P or covering provider during  after hours [place], for this patient?  1. Check the care team in [facility] and look for a) attending/consulting [facility] provider listed and b) the [facility] team listed 2. Log into [email or web address] and use [place]'s universal password to access. If you do not have the password, please contact the hospital operator. 3. Locate the [facility] provider you are looking for under [facility] and page to a number that you can be directly reached. 4. If you still have difficulty reaching the provider, please page the [facility] (Director on Call) for the Hospitalists listed on amion for assistance.

## 2020-08-28 NOTE — ED Notes (Signed)
Pt resting comfortably at this time, NAD noted, respirations even & unlabored, call light within reach

## 2020-08-28 NOTE — Assessment & Plan Note (Signed)
--  stable here but Hgb A1c 8.6 --f/u w/ stroke clinic in 4 weeks

## 2020-08-28 NOTE — ED Notes (Signed)
IV team at bedside 

## 2020-08-28 NOTE — Hospital Course (Signed)
86 year old woman PMH diabetes mellitus type 2, breast cancer presented with left facial droop, left leg weakness for 2 days.  Admitted for acute CVA, neurology evaluation.

## 2020-08-28 NOTE — ED Notes (Signed)
This RN went to round on pt & found pt out of bed & on floor. Staff immediately surrounded pt, assessed pt & inquired as to reason for getting up. Pt stated that she wanted to use the bathroom & forgot that purewick was in place & then, "just went down." Pt denies injury, LOC/hitting head. Pt assisted to stand & ambulate to restroom, NAD noted; pt then assisted back into bed, warm blanket & pillow provided. Fall report completed, MD notified, family number on file called. Will continue to monitor pt

## 2020-08-28 NOTE — ED Notes (Signed)
Pt's daughter, Solmon Ice, updated; pt speaking on phone with daughter

## 2020-08-28 NOTE — Assessment & Plan Note (Addendum)
--  right pontine ischemic infarct w/ ataxia and subtle LUE/LLE weakness on admit --management per stroke service: ASA, Plavix x3 months, then ASA alone, statin added, no further workup planned --echo reassuring --therapy evals pending

## 2020-08-28 NOTE — Assessment & Plan Note (Signed)
--  starting on statin

## 2020-08-28 NOTE — Progress Notes (Signed)
Pt admitted to 3W29.  Alert and oriented to person, place, age; disoriented to time.  Placed on tele box 30; CCMD notified and second verification done.  Pt verbalizes understanding to call before attempting to get out of bed.  Call bell within reach; bed alarm activated.  Denies pain.

## 2020-08-28 NOTE — ED Notes (Signed)
Lunch tray ordered 

## 2020-08-28 NOTE — Progress Notes (Signed)
STROKE TEAM PROGRESS NOTE   INTERVAL HISTORY RN at bedside.  Patient awake alert, orientated to self and place, but not to time or situation.  Slight left facial nasolabial fold flattening, however muscle strength bilaterally equal.  MRI showed right pontine infarct.  CT head and neck showed multifocal intracranial stenosis including basal artery stenosis.  Vitals:   08/28/20 0545 08/28/20 0715 08/28/20 0800 08/28/20 0900  BP: (!) 153/67 (!) 168/77 (!) 146/61 (!) 157/77  Pulse: (!) 57 (!) 59 (!) 58 77  Resp: 16 17 (!) 21 20  Temp:      TempSrc:      SpO2: 98% 98% 100% 92%  Weight:      Height:       CBC:  Recent Labs  Lab 08/27/20 1228  WBC 4.0  NEUTROABS 2.8  HGB 11.2*  HCT 37.1  MCV 77.1*  PLT 295   Basic Metabolic Panel:  Recent Labs  Lab 08/27/20 1228  NA 136  K 4.7  CL 102  CO2 25  GLUCOSE 228*  BUN 16  CREATININE 1.29*  CALCIUM 9.0   Lipid Panel:  Recent Labs  Lab 08/28/20 0357  CHOL 271*  264*  TRIG 160*  157*  HDL 43  42  CHOLHDL 6.3  6.3  VLDL 32  31  LDLCALC 196*  191*   HgbA1c:  Recent Labs  Lab 08/28/20 0357  HGBA1C 8.6*   Urine Drug Screen:  Recent Labs  Lab 08/27/20 1228  LABOPIA NONE DETECTED  COCAINSCRNUR NONE DETECTED  LABBENZ NONE DETECTED  AMPHETMU NONE DETECTED  THCU NONE DETECTED  LABBARB NONE DETECTED    Alcohol Level  Recent Labs  Lab 08/27/20 1228  ETH <10    IMAGING past 24 hours DG Chest 2 View  Result Date: 08/27/2020 CLINICAL DATA:  Facial droop. EXAM: CHEST - 2 VIEW COMPARISON:  September 02, 2016 FINDINGS: Mildly decreased lung volumes are seen which is likely secondary to the degree of patient inspiration. There is no evidence of acute infiltrate, pleural effusion or pneumothorax. There is mild to moderate severity enlargement of the cardiac silhouette. Marked severity calcification of the aortic arch is seen. Moderate severity degenerative changes seen involving the left shoulder. IMPRESSION: No acute  or active cardiopulmonary disease. Electronically Signed   By: Virgina Norfolk M.D.   On: 08/27/2020 21:25   CT HEAD WO CONTRAST  Result Date: 08/27/2020 CLINICAL DATA:  Left-sided facial drooping and dragging left leg. Suspect stroke. EXAM: CT HEAD WITHOUT CONTRAST TECHNIQUE: Contiguous axial images were obtained from the base of the skull through the vertex without intravenous contrast. COMPARISON:  09/02/2016 FINDINGS: Brain: Mild cerebral atrophy is similar to the previous examination. No evidence for acute hemorrhage, mass lesion, midline shift, hydrocephalus or large infarct. Vascular: No hyperdense vessel or unexpected calcification. Skull: Normal. Negative for fracture or focal lesion. Sinuses/Orbits: No acute finding. Other: None. IMPRESSION: 1. No acute intracranial abnormality. 2. Stable mild cerebral atrophy. Electronically Signed   By: Markus Daft M.D.   On: 08/27/2020 11:25   MR BRAIN WO CONTRAST  Result Date: 08/27/2020 CLINICAL DATA:  Stroke, follow-up. EXAM: MRI HEAD WITHOUT CONTRAST TECHNIQUE: Multiplanar, multiecho pulse sequences of the brain and surrounding structures were obtained without intravenous contrast. COMPARISON:  Noncontrast head CT 08/27/2020 FINDINGS: Brain: Moderate generalized parenchymal atrophy. There is a focus of restricted diffusion consistent with acute infarction within the right pons measuring 1.9 x 1.1 x 1.3 cm (series 3, image 18) (series 4, image 19). Corresponding  T2/FLAIR hyperintensity at this site. Moderate patchy T2 hyperintensity within the pons consistent with chronic small vessel ischemic changes. Minimal multifocal T2/FLAIR hyperintensity within the cerebral white matter, nonspecific but also likely reflecting chronic small vessel ischemic disease. Small chronic right basal ganglia lacunar infarct (series 7, image 13). No evidence of intracranial mass. No chronic intracranial blood products. No extra-axial fluid collection. No midline shift. Vascular:  Expected proximal arterial flow voids. Skull and upper cervical spine: No focal marrow lesion. Sinuses/Orbits: Visualized orbits show no acute finding. Minimal ethmoid sinus mucosal thickening. No significant mastoid effusion IMPRESSION: Acute infarct within the right pons measuring 1.9 x 1.1 cm in transaxial dimensions. Background moderate chronic small vessel ischemic changes within the pons and mild chronic small vessel ischemic changes within the cerebral white matter. Small chronic right basal ganglia lacunar infarct. Moderate generalized parenchymal atrophy. Electronically Signed   By: Kellie Simmering DO   On: 08/27/2020 18:51    PHYSICAL EXAM  Pulse Rate:  [57-85] 64 (09/21 1600) Resp:  [14-24] 20 (09/21 1600) BP: (146-171)/(61-104) 152/68 (09/21 1600) SpO2:  [92 %-100 %] 99 % (09/21 1600)  General - Well nourished, well developed, in no apparent distress.  Ophthalmologic - fundi not visualized due to noncooperation.  Cardiovascular - Regular rhythm and rate.  Mental Status -  Level of arousal and orientation to self, age, place were intact, but not to time or situation. Language including expression, naming, repetition, comprehension was assessed and found intact.  Cranial Nerves II - XII - II - Visual field intact OU. III, IV, VI - Extraocular movements intact. V - Facial sensation intact bilaterally. VII - slight left nasolabial fold flattening. VIII - Hearing & vestibular intact bilaterally. X - Palate elevates symmetrically. XI - Chin turning & shoulder shrug intact bilaterally. XII - Tongue protrusion intact.  Motor Strength - The patient's strength was normal in all extremities and pronator drift was absent.  No significant drift bilaterally.  Bulk was normal and fasciculations were absent.   Motor Tone - Muscle tone was assessed at the neck and appendages and was normal.  Reflexes - The patient's reflexes were symmetrical in all extremities and she had no pathological  reflexes.  Sensory - Light touch, temperature/pinprick were assessed and were symmetrical.    Coordination - The patient had normal movements in the hands grossly intact with no ataxia or dysmetria.  Tremor was absent.  Gait and Station - deferred.   ASSESSMENT/PLAN Ms. Rita Douglas is a 86 y.o. female with history of DM, HTN, breast cancer and Alzheimer's dementia presenting to [place] with 2d hx L facial droop and L sided weakness.   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 Atrophy.   MRI  Acute R pontine infarct. Small vessel disease. Atrophy. Chronic R basal ganglia lacune.   CTA head & neck multifocal severe stenosis including basal artery, bilateral ICA siphon, bilateral PCAs, left M2  2D Echo EF 60-65%  LDL 196   HgbA1c 8.6  VTE prophylaxis - SCDs   No antithrombotic prior to admission, now on aspirin 325 and plavix 75 daily DAPT for 3 months and then ASA alone given BA stenosis.   Therapy recommendations:  pending   Disposition:  pending   Multifocal intracranial stenosis  CTA head & neck multifocal severe stenosis including basilar artery, bilateral ICA siphon, bilateral PCAs, left M2  Patient current stroke most likely due to basilar artery stenosis  Given patient age, multifocal involvement, Alzheimer dementia, do not feel patient is a candidate for any endovascular intervention, risk more than benefit.  Strict risk factor control and medication would be the best first-line treatment.  Hypertension  Stable . Permissive hypertension (OK if < 220/120) but gradually normalize in 5-7 days . Long-term BP goal 130-150 given multifocal intracranial stenosis  Hyperlipidemia  Home meds:  None  LDL 196, goal < 70  Add Lipitor 80  Continue statin at discharge  Diabetes type II Uncontrolled  HgbA1c 8.6, goal < 7.0  SSI  CBG monitoring  Close PCP follow-up for better DM  control  Other Stroke Risk Factors  Advanced age  Other Active Problems  Alzheimer dementia  Breast cancer status post mastectomy  AKI vs CKD, creatinine 1.29  UTI, UA WBC 21-50, on Rocephin 9/20>>   Hospital day # 1  Neurology will sign off. Please call with questions. Pt will follow up with stroke clinic NP at [facility] in about 4 weeks. Thanks for the consult.  Rosalin Hawking, MD PhD Stroke Neurology 08/28/2020 4:39 PM  To contact Stroke Continuity provider, please refer to [email or web address] After hours, contact General Neurology

## 2020-08-28 NOTE — ED Notes (Signed)
Breakfast at bedside.

## 2020-08-28 NOTE — ED Notes (Signed)
Pt placed on purewick & instructed on utilization of same. Pt expressed understanding, states she will utilize call light when urine specimen obtained

## 2020-08-28 NOTE — ED Notes (Signed)
This RN went to check on pt, found pt to be sitting on edge of bed, pulling monitoring cords off. This RN was able to redirect pt back into bed & reattach cords to pt; Pt reminded to stay in bed for safety, voiced understanding. Provided pillow & blanket for comfort, lights dimmed.

## 2020-08-28 NOTE — Evaluation (Signed)
Speech Language Pathology Evaluation Patient Details Name: Rita Douglas MRN: [id number] DOB: 1934/03/03 Today's Date: 08/28/2020 Time: 6213-0865 SLP Time Calculation (min) (ACUTE ONLY): 11 min  Problem List:  Patient Active Problem List   Diagnosis Date Noted  . AKI (acute kidney injury) ([place]) 08/28/2020  . Acute lower UTI 08/28/2020  . Alzheimer's dementia ([place]) 08/28/2020  . Aortic atherosclerosis ([place]) 08/28/2020  . CVA (cerebral vascular accident) ([place]) 08/27/2020  . DM (diabetes mellitus), type 2 ([place]) 08/27/2020  . Essential hypertension 08/27/2020   Past Medical History:  Past Medical History:  Diagnosis Date  . Alzheimer's dementia ([place])   . Aortic atherosclerosis ([place]) 08/28/2020  . Breast cancer ([place])   . CVA (cerebral vascular accident) ([place]) 08/27/2020  . Diabetes mellitus without complication ([place])   . Hypertension    Past Surgical History:  Past Surgical History:  Procedure Laterality Date  . MASTECTOMY     HPI:  86 y.o. female with a history of DM, HTN, breast cancer and Alzheimer's dementia who initially presented to [facility] with a 2 day history of left facial droop and LLE weakness manifesting as dragging of her left leg while ambulating. Exam by the EDP revealed slight drift of her LUE, mild dysmetria of her LUE and LLE and slight facial asymmetry. She was transferred to the [facility] ED for an MRI, which revealed an acute right paramedian pontine ischemic infarction.    Assessment / Plan / Recommendation Clinical Impression  Pt presents with fluent, clear speech with no dysarthria, intact expressive and receptive language, deficits in higher-level attention, recall, and awareness.  Baseline level of cognitive function is unknown beyond presence of dementia.  Recommend SLP f/u for deeper assessment.     SLP Assessment  SLP Recommendation/Assessment: Patient needs continued Speech Lanaguage Pathology Services SLP Visit Diagnosis: Cognitive communication deficit (R41.841)     Follow Up Recommendations  Other (comment) (tba)    Frequency and Duration min 2x/week  1 week      SLP Evaluation Cognition  Overall Cognitive Status: Difficult to assess Arousal/Alertness: Awake/alert Orientation Level: Oriented to person;Disoriented to time;Disoriented to place Attention: Sustained Sustained Attention: Appears intact Memory: Impaired Memory Impairment: Retrieval deficit Awareness: Impaired Awareness Impairment: Intellectual impairment Safety/Judgment: Impaired       Comprehension  Auditory Comprehension Overall Auditory Comprehension: Appears within functional limits for tasks assessed Yes/No Questions: Within Functional Limits Commands: Impaired Multistep Basic Commands: 50-74% accurate Conversation: Simple Reading Comprehension Reading Status:  (able to read signage on wall)    Expression Expression Primary Mode of Expression: Verbal Verbal Expression Overall Verbal Expression: Appears within functional limits for tasks assessed Level of Generative/Spontaneous Verbalization: Conversation Repetition: No impairment Written Expression Written Expression: Not tested   Oral / Motor  Oral Motor/Sensory Function Overall Oral Motor/Sensory Function: Within functional limits Motor Speech Overall Motor Speech: Appears within functional limits for tasks assessed   GO                    [name] 08/28/2020, 5:08 PM  Amorina Doerr L. Tivis Ringer, [place] Office number [phone] Pager [phone]

## 2020-08-29 LAB — BASIC METABOLIC PANEL
Anion gap: 9 (ref 5–15)
BUN: 17 mg/dL (ref 8–23)
CO2: 21 mmol/L — ABNORMAL LOW (ref 22–32)
Calcium: 9 mg/dL (ref 8.9–10.3)
Chloride: 106 mmol/L (ref 98–111)
Creatinine, Ser: 1.2 mg/dL — ABNORMAL HIGH (ref 0.44–1.00)
GFR calc Af Amer: 48 mL/min — ABNORMAL LOW (ref >60)
GFR calc non Af Amer: 41 mL/min — ABNORMAL LOW (ref >60)
Glucose, Bld: 108 mg/dL — ABNORMAL HIGH (ref 70–99)
Potassium: 4.9 mmol/L (ref 3.5–5.1)
Sodium: 136 mmol/L (ref 135–145)

## 2020-08-29 LAB — GLUCOSE, CAPILLARY
Glucose-Capillary: 69 mg/dL — ABNORMAL LOW (ref 70–99)
Glucose-Capillary: 123 mg/dL — ABNORMAL HIGH (ref 70–99)
Glucose-Capillary: 138 mg/dL — ABNORMAL HIGH (ref 70–99)
Glucose-Capillary: 137 mg/dL — ABNORMAL HIGH (ref 70–99)
Glucose-Capillary: 162 mg/dL — ABNORMAL HIGH (ref 70–99)

## 2020-08-29 MED ORDER — CLOPIDOGREL BISULFATE 75 MG PO TABS: 75.0000 mg | ORAL_TABLET | Freq: Every day | ORAL | 2 refills | Status: AC

## 2020-08-29 MED ORDER — ATORVASTATIN CALCIUM 80 MG PO TABS: 80.0000 mg | ORAL_TABLET | Freq: Every day | ORAL | 2 refills | Status: AC

## 2020-08-29 MED ORDER — ASPIRIN 325 MG PO TBEC: 325.0000 mg | DELAYED_RELEASE_TABLET | Freq: Every day | ORAL | 6 refills | Status: AC

## 2020-08-29 NOTE — Progress Notes (Signed)
  Speech Language Pathology Treatment: Cognitive-Linquistic  Patient Details Name: Rita Douglas MRN: [id number] DOB: 04/08/1934 Today's Date: 08/29/2020 Time: 5284-1324 SLP Time Calculation (min) (ACUTE ONLY): 15 min  Assessment / Plan / Recommendation Clinical Impression  F/u after yesterday's initial assessment.  Pt talkative and in good spirits, hoping to go home today.  She states that she is "seldom home alone," and that her daughter is with her most of the day.  She demonstrated functional  problem solving for basic phone/call bell use.  She remains disoriented to time (year, month, DOW, date) and says she was admitted "for a work-up" but is unable to recall/state that she had a stroke.  Phoned daughter to glean more information about baseline level of cognition but there was no answer.  OT/PT evaluations are pending.  Based on site of neurological infarct, suspect cognition is near baseline. Will follow.   HPI HPI: 86 y.o. female with a history of DM, HTN, breast cancer and Alzheimer's dementia who initially presented to [facility] with a 2 day history of left facial droop and LLE weakness manifesting as dragging of her left leg while ambulating. Exam by the EDP revealed slight drift of her LUE, mild dysmetria of her LUE and LLE and slight facial asymmetry. She was transferred to the [facility] ED for an MRI, which revealed an acute right paramedian pontine ischemic infarction.       SLP Plan  Discharge SLP treatment due to likely D/C today.       Recommendations   Will likely need 24 hour supervision, at least initially.                  Oral Care Recommendations: Oral care BID Follow up Recommendations: None;24 hour supervision/assistance SLP Visit Diagnosis: Cognitive communication deficit (M01.027) Plan: Discharge SLP treatment due to (comment)       GO              [name] L. Tivis Ringer, [place] CCC/SLP Acute Rehabilitation Services Office number [phone] Pager  [phone]  [name] 08/29/2020, 11:51 AM

## 2020-08-29 NOTE — Discharge Summary (Signed)
[facility]  Physician Discharge Summary   Patient ID: Rita Douglas MRN: [id number] DOB/AGE: [birth year] 84 y.o.  Admit date: 08/27/2020 Discharge date: 08/29/2020  PCP: Iva Lento, PA-C  DISCHARGE DIAGNOSES:  Acute right pontine CVA Essential hypertension Diabetes mellitus type 2 without complication Renal insufficiency, unclear if AKI or CKD Alzheimer's dementia   RECOMMENDATIONS FOR OUTPATIENT FOLLOW UP: 1. Ambulatory referral sent to neurology 2. Home health ordered    Home Health:PT, OT  Equipment/Devices:RW   CODE STATUS: Full code  DISCHARGE CONDITION: fair  Diet recommendation: Modified carbohydrate  INITIAL HISTORY: 86 year old woman PMH diabetes mellitus type 2, breast cancer presented with left facial droop, left leg weakness for 2 days.  Admitted for acute CVA, neurology evaluation.  Consultations:  Neurology    HOSPITAL COURSE:   Acute CVA (cerebral vascular accident)  Patient presented with left-sided weakness.  She was found to have a right pontine ischemic infarct.  She was also noted to have ataxia. Seen by neurology.  Started on aspirin and Plavix.  Plan is to do dual antiplatelet treatment for 3 months followed by aspirin alone. She underwent stroke work-up including CT angiogram head and neck along with echocardiogram.  Normal systolic function was noted.   Extensive stenosis is noted on the CT angiograms however none of them requiring any intervention per neurology.  Medical management is recommended.  LDL noted to be 196.  Started on atorvastatin.  HbA1c 8.6. Seen by PT. Home health will be ordered. Follow-up w/ stroke clinic in 4 weeks.  Ambulatory referral sent to neurology.  Essential hypertension Permissive hypertension was allowed.  May resume lisinopril at discharge.  Hyperlipidemia LDL 196.  Started on atorvastatin.  DM (diabetes mellitus), type 2  HbA1c noted to be 8.6.  Will not strive for strict control  considering her advanced age.  May resume Metformin at discharge.    Renal insufficiency No old labs available.  Creatinine is stable for the most part.  Unclear if she has CKD.  Cannot determine at this time.  Aortic atherosclerosis  Aspirin statin  Alzheimer's dementia Stable.  Not on any medications for same.  Abnormal urinalysis Did not have any dysuria or any other urinary symptoms.  Antibacterials were discontinued.    Overall stable. Discussed with the daughter.  [place] for discharge today.      PERTINENT LABS:  The results of significant diagnostics from this hospitalization (including imaging, microbiology, ancillary and laboratory) are listed below for reference.    Microbiology: Recent Results (from the past 240 hour(s))  SARS Coronavirus 2 by RT PCR (hospital order, performed in [facility] hospital lab) Nasopharyngeal Nasopharyngeal Swab     Status: None   Collection Time: 08/27/20  7:04 PM   Specimen: Nasopharyngeal Swab  Result Value Ref Range Status   SARS Coronavirus 2 NEGATIVE NEGATIVE Final    Comment: (NOTE) SARS-CoV-2 target nucleic acids are NOT DETECTED.  The SARS-CoV-2 RNA is generally detectable in upper and lower respiratory specimens during the acute phase of infection. The lowest concentration of SARS-CoV-2 viral copies this assay can detect is 250 copies / mL. A negative result does not preclude SARS-CoV-2 infection and should not be used as the sole basis for treatment or other patient management decisions.  A negative result may occur with improper specimen collection / handling, submission of specimen other than nasopharyngeal swab, presence of viral mutation(s) within the areas targeted by this assay, and inadequate number of viral copies (<250 copies / mL). A negative result must be  combined with clinical observations, patient history, and epidemiological information.  Fact Sheet for Patients:    StrictlyIdeas.no  Fact Sheet for Healthcare Providers: BankingDealers.co.za  This test is not yet approved or  cleared by the [place] FDA and has been authorized for detection and/or diagnosis of SARS-CoV-2 by FDA under an Emergency Use Authorization (EUA).  This EUA will remain in effect (meaning this test can be used) for the duration of the COVID-19 declaration under Section 564(b)(1) of the Act, 21 U.S.C. section 360bbb-3(b)(1), unless the authorization is terminated or revoked sooner.  Performed at [facility], [place] [address] [place], [place] [address]   Culture, Urine     Status: Abnormal   Collection Time: 08/28/20  3:41 AM   Specimen: Urine, Random  Result Value Ref Range Status   Specimen Description URINE, RANDOM  Final   Special Requests   Final    NONE Performed at [facility], [place] [address], [place], [place] [address]    Culture MULTIPLE SPECIES PRESENT, SUGGEST RECOLLECTION (A)  Final   Report Status 08/28/2020 FINAL  Final     Labs:    Basic Metabolic Panel: Recent Labs  Lab 08/27/20 1228 08/29/20 0410  NA 136 136  K 4.7 4.9  CL 102 106  CO2 25 21*  GLUCOSE 228* 108*  BUN 16 17  CREATININE 1.29* 1.20*  CALCIUM 9.0 9.0   Liver Function Tests: Recent Labs  Lab 08/27/20 1228  AST 17  ALT 11  ALKPHOS 77  BILITOT 0.8  PROT 7.3  ALBUMIN 3.8   CBC: Recent Labs  Lab 08/27/20 1228  WBC 4.0  NEUTROABS 2.8  HGB 11.2*  HCT 37.1  MCV 77.1*  PLT 244    CBG: Recent Labs  Lab 08/28/20 2332 08/29/20 0344 08/29/20 0535 08/29/20 0733 08/29/20 1201  GLUCAP 185* 69* 123* 138* 137*     IMAGING STUDIES CT ANGIO HEAD W OR WO CONTRAST  Result Date: 08/28/2020 CLINICAL DATA:  86 year old female with left side symptoms, right pontine infarct on MRI yesterday. EXAM: CT ANGIOGRAPHY HEAD AND NECK TECHNIQUE: Multidetector CT imaging of the head and neck was performed using the  standard protocol during bolus administration of intravenous contrast. Multiplanar CT image reconstructions and MIPs were obtained to evaluate the vascular anatomy. Carotid stenosis measurements (when applicable) are obtained utilizing NASCET criteria, using the distal internal carotid diameter as the denominator. CONTRAST:  47mL OMNIPAQUE IOHEXOL 350 MG/ML SOLN COMPARISON:  Head CT and brain MRI 08/27/2020. FINDINGS: CT HEAD Brain: Calcified atherosclerosis at the skull base. Mild hypodensity in the right pons corresponding to the acute infarct by MRI on series 5, image 10. No associated hemorrhage or mass effect. Elsewhere gray-white matter differentiation in the brain is stable and normal for age. No midline shift, mass effect, or evidence of intracranial mass lesion. No acute intracranial hemorrhage identified. No ventriculomegaly. Calvarium and skull base: No acute osseous abnormality identified. Paranasal sinuses: Visualized paranasal sinuses and mastoids are stable and well pneumatized. Orbits: No acute orbit or scalp soft tissue finding. CTA NECK Skeleton: Absent dentition. Widespread cervical spine degeneration with multilevel spondylolisthesis. No acute osseous abnormality identified. Upper chest: Negative. Other neck: Mild motion artifact in the pharynx.  No acute findings. Aortic arch: Mild ectasia of the aortic arch. Calcified aortic atherosclerosis. 3 vessel arch configuration. Right carotid system: Brachiocephalic artery plaque without stenosis. Tortuous proximal right CCA. Negative right carotid bifurcation. Negative cervical right ICA aside from tortuosity. Left carotid system: Negative  but otherwise tortuous left CCA. Calcified plaque at the left ICA origin and bulb with less than 50 % stenosis with respect to the distal vessel. Tortuous left ICA distal to the bulb without additional plaque. Vertebral arteries: Proximal right subclavian artery plaque without stenosis. Normal right vertebral artery  origin. Tortuous but otherwise negative cervical right vertebral artery. Proximal left subclavian artery soft and calcified plaque with stenosis up to 55-60 % with respect to the distal vessel (series 12, image 80). Partially calcified origin of the left vertebral artery is short distance distal to the stenosis, with moderate vertebral origin stenosis. Tortuous but otherwise negative left V2 segment. Minimal calcified plaque in the left V3 segment without stenosis. CTA HEAD Posterior circulation: Fairly codominant distal vertebral arteries are patent without plaque or stenosis. Normal PICA origins. Patent vertebrobasilar junction. The proximal basilar artery is within normal limits. But there is a high-grade mid basilar stenosis best seen on series 16, image 21, with short segment radiographic string sign. The distal basilar is patent. SCA and PCA origins are patent although irregular. There is multifocal moderate to severe stenosis of the left PCA P1 and P2 segments (series 16 image 20). Preserved distal left PCA enhancement. Multifocal mild to moderate stenosis of the right PCA (series 16, image 17) with preserved distal enhancement. Posterior communicating arteries are diminutive or absent. Anterior circulation: Right ICA siphon is patent with moderate cavernous segment plaque and stenosis (series 13, image 75), plus moderate to severe proximal supraclinoid segment stenosis beginning at the anterior genu (series 12, image 77). The right ICA terminus remains patent. The left ICA siphon is larger, appears dominant and is patent. The left siphon demonstrates combined ectasia and multifocal calcified plaque with moderate cavernous segment stenosis (series 13, image 107 and series 11, image 113) and mild to moderate. The left ICA terminus is patent. Patent MCA and ACA origins. The left A1 is dominant, the right A1 is diminutive or absent. Anterior communicating artery and bilateral ACA branches are within normal  limits. Left MCA M1 segment bifurcates early without stenosis. But there is moderate stenosis of the superior left M2 division which remains patent. See series 15, image 16. Other left MCA branches are within normal limits. Right MCA M1 segment and bifurcation are patent without stenosis. Right MCA branches are within normal limits. Venous sinuses: Early contrast timing. The superior sagittal sinus is patent. Anatomic variants: Dominant left and diminutive or absent right ACA A1 segments. Review of the MIP images confirms the above findings IMPRESSION: 1. Positive for Severe Intracranial Atherosclerosis with significant stenoses: - Severe mid Basilar stenosis, possibly related to the acute pontine infarct. - moderate to severe Right ICA supraclinoid stenosis, moderate bilateral cavernous ICA stenoses. - moderate to severe left and moderate right PCA stenoses. - moderate stenosis of a Left MCA M2. 2. No significant cervical carotid stenosis in the neck. Up to 60% stenosis of the proximal Left Subclavian Artery and Moderate stenosis at the origin of the Left Vertebral Artery. 3. Expected CT appearance of the small right pontine infarct, with no hemorrhage or mass effect. No new intracranial abnormality. 4. Aortic Atherosclerosis (ICD10-I70.0). Electronically Signed   By: Genevie Ann M.D.   On: 08/28/2020 12:57   DG Chest 2 View  Result Date: 08/27/2020 CLINICAL DATA:  Facial droop. EXAM: CHEST - 2 VIEW COMPARISON:  September 02, 2016 FINDINGS: Mildly decreased lung volumes are seen which is likely secondary to the degree of patient inspiration. There is no evidence of acute infiltrate,  pleural effusion or pneumothorax. There is mild to moderate severity enlargement of the cardiac silhouette. Marked severity calcification of the aortic arch is seen. Moderate severity degenerative changes seen involving the left shoulder. IMPRESSION: No acute or active cardiopulmonary disease. Electronically Signed   By: Virgina Norfolk M.D.   On: 08/27/2020 21:25   CT HEAD WO CONTRAST  Result Date: 08/27/2020 CLINICAL DATA:  Left-sided facial drooping and dragging left leg. Suspect stroke. EXAM: CT HEAD WITHOUT CONTRAST TECHNIQUE: Contiguous axial images were obtained from the base of the skull through the vertex without intravenous contrast. COMPARISON:  09/02/2016 FINDINGS: Brain: Mild cerebral atrophy is similar to the previous examination. No evidence for acute hemorrhage, mass lesion, midline shift, hydrocephalus or large infarct. Vascular: No hyperdense vessel or unexpected calcification. Skull: Normal. Negative for fracture or focal lesion. Sinuses/Orbits: No acute finding. Other: None. IMPRESSION: 1. No acute intracranial abnormality. 2. Stable mild cerebral atrophy. Electronically Signed   By: Markus Daft M.D.   On: 08/27/2020 11:25   CT ANGIO NECK W OR WO CONTRAST  Result Date: 08/28/2020 CLINICAL DATA:  86 year old female with left side symptoms, right pontine infarct on MRI yesterday. EXAM: CT ANGIOGRAPHY HEAD AND NECK TECHNIQUE: Multidetector CT imaging of the head and neck was performed using the standard protocol during bolus administration of intravenous contrast. Multiplanar CT image reconstructions and MIPs were obtained to evaluate the vascular anatomy. Carotid stenosis measurements (when applicable) are obtained utilizing NASCET criteria, using the distal internal carotid diameter as the denominator. CONTRAST:  51mL OMNIPAQUE IOHEXOL 350 MG/ML SOLN COMPARISON:  Head CT and brain MRI 08/27/2020. FINDINGS: CT HEAD Brain: Calcified atherosclerosis at the skull base. Mild hypodensity in the right pons corresponding to the acute infarct by MRI on series 5, image 10. No associated hemorrhage or mass effect. Elsewhere gray-white matter differentiation in the brain is stable and normal for age. No midline shift, mass effect, or evidence of intracranial mass lesion. No acute intracranial hemorrhage identified. No  ventriculomegaly. Calvarium and skull base: No acute osseous abnormality identified. Paranasal sinuses: Visualized paranasal sinuses and mastoids are stable and well pneumatized. Orbits: No acute orbit or scalp soft tissue finding. CTA NECK Skeleton: Absent dentition. Widespread cervical spine degeneration with multilevel spondylolisthesis. No acute osseous abnormality identified. Upper chest: Negative. Other neck: Mild motion artifact in the pharynx.  No acute findings. Aortic arch: Mild ectasia of the aortic arch. Calcified aortic atherosclerosis. 3 vessel arch configuration. Right carotid system: Brachiocephalic artery plaque without stenosis. Tortuous proximal right CCA. Negative right carotid bifurcation. Negative cervical right ICA aside from tortuosity. Left carotid system: Negative but otherwise tortuous left CCA. Calcified plaque at the left ICA origin and bulb with less than 50 % stenosis with respect to the distal vessel. Tortuous left ICA distal to the bulb without additional plaque. Vertebral arteries: Proximal right subclavian artery plaque without stenosis. Normal right vertebral artery origin. Tortuous but otherwise negative cervical right vertebral artery. Proximal left subclavian artery soft and calcified plaque with stenosis up to 55-60 % with respect to the distal vessel (series 12, image 80). Partially calcified origin of the left vertebral artery is short distance distal to the stenosis, with moderate vertebral origin stenosis. Tortuous but otherwise negative left V2 segment. Minimal calcified plaque in the left V3 segment without stenosis. CTA HEAD Posterior circulation: Fairly codominant distal vertebral arteries are patent without plaque or stenosis. Normal PICA origins. Patent vertebrobasilar junction. The proximal basilar artery is within normal limits. But there is a high-grade mid  basilar stenosis best seen on series 16, image 21, with short segment radiographic string sign. The distal  basilar is patent. SCA and PCA origins are patent although irregular. There is multifocal moderate to severe stenosis of the left PCA P1 and P2 segments (series 16 image 20). Preserved distal left PCA enhancement. Multifocal mild to moderate stenosis of the right PCA (series 16, image 17) with preserved distal enhancement. Posterior communicating arteries are diminutive or absent. Anterior circulation: Right ICA siphon is patent with moderate cavernous segment plaque and stenosis (series 13, image 75), plus moderate to severe proximal supraclinoid segment stenosis beginning at the anterior genu (series 12, image 77). The right ICA terminus remains patent. The left ICA siphon is larger, appears dominant and is patent. The left siphon demonstrates combined ectasia and multifocal calcified plaque with moderate cavernous segment stenosis (series 13, image 107 and series 11, image 113) and mild to moderate. The left ICA terminus is patent. Patent MCA and ACA origins. The left A1 is dominant, the right A1 is diminutive or absent. Anterior communicating artery and bilateral ACA branches are within normal limits. Left MCA M1 segment bifurcates early without stenosis. But there is moderate stenosis of the superior left M2 division which remains patent. See series 15, image 16. Other left MCA branches are within normal limits. Right MCA M1 segment and bifurcation are patent without stenosis. Right MCA branches are within normal limits. Venous sinuses: Early contrast timing. The superior sagittal sinus is patent. Anatomic variants: Dominant left and diminutive or absent right ACA A1 segments. Review of the MIP images confirms the above findings IMPRESSION: 1. Positive for Severe Intracranial Atherosclerosis with significant stenoses: - Severe mid Basilar stenosis, possibly related to the acute pontine infarct. - moderate to severe Right ICA supraclinoid stenosis, moderate bilateral cavernous ICA stenoses. - moderate to severe  left and moderate right PCA stenoses. - moderate stenosis of a Left MCA M2. 2. No significant cervical carotid stenosis in the neck. Up to 60% stenosis of the proximal Left Subclavian Artery and Moderate stenosis at the origin of the Left Vertebral Artery. 3. Expected CT appearance of the small right pontine infarct, with no hemorrhage or mass effect. No new intracranial abnormality. 4. Aortic Atherosclerosis (ICD10-I70.0). Electronically Signed   By: Genevie Ann M.D.   On: 08/28/2020 12:57   MR BRAIN WO CONTRAST  Result Date: 08/27/2020 CLINICAL DATA:  Stroke, follow-up. EXAM: MRI HEAD WITHOUT CONTRAST TECHNIQUE: Multiplanar, multiecho pulse sequences of the brain and surrounding structures were obtained without intravenous contrast. COMPARISON:  Noncontrast head CT 08/27/2020 FINDINGS: Brain: Moderate generalized parenchymal atrophy. There is a focus of restricted diffusion consistent with acute infarction within the right pons measuring 1.9 x 1.1 x 1.3 cm (series 3, image 18) (series 4, image 19). Corresponding T2/FLAIR hyperintensity at this site. Moderate patchy T2 hyperintensity within the pons consistent with chronic small vessel ischemic changes. Minimal multifocal T2/FLAIR hyperintensity within the cerebral white matter, nonspecific but also likely reflecting chronic small vessel ischemic disease. Small chronic right basal ganglia lacunar infarct (series 7, image 13). No evidence of intracranial mass. No chronic intracranial blood products. No extra-axial fluid collection. No midline shift. Vascular: Expected proximal arterial flow voids. Skull and upper cervical spine: No focal marrow lesion. Sinuses/Orbits: Visualized orbits show no acute finding. Minimal ethmoid sinus mucosal thickening. No significant mastoid effusion IMPRESSION: Acute infarct within the right pons measuring 1.9 x 1.1 cm in transaxial dimensions. Background moderate chronic small vessel ischemic changes within the pons and mild  chronic  small vessel ischemic changes within the cerebral white matter. Small chronic right basal ganglia lacunar infarct. Moderate generalized parenchymal atrophy. Electronically Signed   By: Kellie Simmering DO   On: 08/27/2020 18:51   ECHOCARDIOGRAM COMPLETE  Result Date: 08/28/2020    ECHOCARDIOGRAM REPORT   Patient Name:   [facility] Date of Exam: 08/28/2020 Medical Rec #:  [id number]   Height:       64.0 in Accession #:    [id number]  Weight:       128.0 lb Date of Birth:  20-Feb-1934   BSA:          1.618 m Patient Age:    40 years    BP:           146/61 mmHg Patient Gender: F           HR:           67 bpm. Exam Location:  Inpatient Procedure: 2D Echo, Color Doppler and Cardiac Doppler Indications:    Stroke i163.9  History:        Patient has no prior history of Echocardiogram examinations.                 Risk Factors:Hypertension and Diabetes.  Sonographer:    Raquel Sarna Senior RDCS Referring Phys: [place]  Sonographer Comments: Technically difficult due to thin body habitus. IMPRESSIONS  1. Left ventricular ejection fraction, by estimation, is 60 to 65%. The left ventricle has normal function. The left ventricle has no regional wall motion abnormalities. Left ventricular diastolic parameters are consistent with Grade I diastolic dysfunction (impaired relaxation).  2. Right ventricular systolic function is normal. The right ventricular size is normal.  3. The mitral valve is normal in structure. Trivial mitral valve regurgitation. No evidence of mitral stenosis.  4. The aortic valve is normal in structure. Aortic valve regurgitation is mild. Mild aortic valve sclerosis is present, with no evidence of aortic valve stenosis.  5. The inferior vena cava is normal in size with greater than 50% respiratory variability, suggesting right atrial pressure of 3 mmHg. FINDINGS  Left Ventricle: Left ventricular ejection fraction, by estimation, is 60 to 65%. The left ventricle has normal function. The left ventricle  has no regional wall motion abnormalities. The left ventricular internal cavity size was normal in size. There is  no left ventricular hypertrophy. Left ventricular diastolic parameters are consistent with Grade I diastolic dysfunction (impaired relaxation). Normal left ventricular filling pressure. Right Ventricle: The right ventricular size is normal. No increase in right ventricular wall thickness. Right ventricular systolic function is normal. Left Atrium: Left atrial size was normal in size. Right Atrium: Right atrial size was normal in size. Pericardium: There is no evidence of pericardial effusion. Mitral Valve: The mitral valve is normal in structure. Trivial mitral valve regurgitation. No evidence of mitral valve stenosis. Tricuspid Valve: The tricuspid valve is normal in structure. Tricuspid valve regurgitation is not demonstrated. No evidence of tricuspid stenosis. Aortic Valve: The aortic valve is normal in structure. Aortic valve regurgitation is mild. Mild aortic valve sclerosis is present, with no evidence of aortic valve stenosis. Pulmonic Valve: The pulmonic valve was normal in structure. Pulmonic valve regurgitation is not visualized. No evidence of pulmonic stenosis. Aorta: The aortic root is normal in size and structure. Venous: The inferior vena cava is normal in size with greater than 50% respiratory variability, suggesting right atrial pressure of 3 mmHg. IAS/Shunts: No atrial level shunt detected by color  flow Doppler.  LEFT VENTRICLE PLAX 2D LVIDd:         3.90 cm  Diastology LVIDs:         1.90 cm  LV e' medial:    3.81 cm/s LV PW:         0.70 cm  LV E/e' medial:  11.4 LV IVS:        1.00 cm  LV e' lateral:   6.31 cm/s LVOT diam:     1.70 cm  LV E/e' lateral: 6.9 LV SV:         45 LV SV Index:   28 LVOT Area:     2.27 cm  RIGHT VENTRICLE RV S prime:     9.03 cm/s TAPSE (M-mode): 1.5 cm LEFT ATRIUM           Index       RIGHT ATRIUM          Index LA diam:      4.00 cm 2.47 cm/m  RA Area:      9.32 cm LA Vol (A2C): 28.2 ml 17.42 ml/m RA Volume:   13.80 ml 8.53 ml/m LA Vol (A4C): 35.6 ml 22.00 ml/m  AORTIC VALVE LVOT Vmax:   103.90 cm/s LVOT Vmean:  68.650 cm/s LVOT VTI:    0.198 m MITRAL VALVE MV Area (PHT): 1.68 cm    SHUNTS MV Decel Time: 451 msec    Systemic VTI:  0.20 m MV E velocity: 43.30 cm/s  Systemic Diam: 1.70 cm MV A velocity: 96.10 cm/s MV E/A ratio:  0.45 Mihai Croitoru MD Electronically signed by Sanda Klein MD Signature Date/Time: 08/28/2020/1:03:03 PM    Final     DISCHARGE EXAMINATION: Vitals:   08/28/20 2333 08/29/20 0339 08/29/20 0738 08/29/20 1201  BP: (!) 163/69 (!) 161/71 (!) 170/70 (!) 166/73  Pulse: 89 63 61 78  Resp: 20 18 18 16   Temp: (!) 97.5 F (36.4 C) 97.6 F (36.4 C) 98.1 F (36.7 C) 98 F (36.7 C)  TempSrc: Oral Oral Oral Oral  SpO2: 100% 100% 100% 99%  Weight:      Height:       General appearance: Awake alert.  In no distress Resp: Clear to auscultation bilaterally.  Normal effort Cardio: S1-S2 is normal regular.  No S3-S4.  No rubs murmurs or bruit GI: Abdomen is soft.  Nontender nondistended.  Bowel sounds are present normal.  No masses organomegaly Subtle left-sided weakness but improved per patient.   DISPOSITION: home with daughter  Discharge Instructions    Ambulatory referral to Neurology   Complete by: As directed    Follow up with stroke clinic NP (Jessica Vanschaick or Cecille Rubin, if both not available, consider Zachery Dauer, or Ahern) at [facility] in about 4 weeks. Thanks.   Call MD for:  difficulty breathing, headache or visual disturbances   Complete by: As directed    Call MD for:  extreme fatigue   Complete by: As directed    Call MD for:  persistant dizziness or light-headedness   Complete by: As directed    Call MD for:  persistant nausea and vomiting   Complete by: As directed    Call MD for:  severe uncontrolled pain   Complete by: As directed    Call MD for:  temperature >100.4   Complete by: As  directed    Diet - low sodium heart healthy   Complete by: As directed    Diet Carb Modified   Complete  by: As directed    Discharge instructions   Complete by: As directed    Please take your medications as prescribed.  Follow-up with your PCP in 1 week.  A referral has been sent to neurology office for follow-up in a few weeks.  You were cared for by a hospitalist during your hospital stay. If you have any questions about your discharge medications or the care you received while you were in the hospital after you are discharged, you can call the unit and asked to speak with the hospitalist on call if the hospitalist that took care of you is not available. Once you are discharged, your primary care physician will handle any further medical issues. Please note that NO REFILLS for any discharge medications will be authorized once you are discharged, as it is imperative that you return to your primary care physician (or establish a relationship with a primary care physician if you do not have one) for your aftercare needs so that they can reassess your need for medications and monitor your lab values. If you do not have a primary care physician, you can call [phone] for a physician referral.   Increase activity slowly   Complete by: As directed         Allergies as of 08/29/2020   No Known Allergies     Medication List    TAKE these medications   aspirin 325 MG EC tablet Take 1 tablet (325 mg total) by mouth daily. Start taking on: August 30, 2020   atorvastatin 80 MG tablet Commonly known as: LIPITOR Take 1 tablet (80 mg total) by mouth daily. Start taking on: August 30, 2020   clopidogrel 75 MG tablet Commonly known as: PLAVIX Take 1 tablet (75 mg total) by mouth daily. For 3 months only Start taking on: August 30, 2020   lisinopril 10 MG tablet Commonly known as: ZESTRIL Take 10 mg by mouth daily.   metFORMIN 1000 MG tablet Commonly known as: GLUCOPHAGE Take  1,000 mg by mouth 2 (two) times daily with a meal.         Follow-up Information    [facility]. Schedule an appointment as soon as possible for a visit in 4 week(s).   Specialty: Neurology Contact information: [address] [place] [phone]       Iva Lento, PA-C. Schedule an appointment as soon as possible for a visit in 1 week(s).   Specialty: Internal Medicine Contact information: [place] [address] [phone]               TOTAL DISCHARGE TIME: 33 mins  [place]  [facility] Pager on [email or web address]  08/29/2020, 2:27 PM

## 2020-08-29 NOTE — Evaluation (Signed)
Physical Therapy Evaluation Patient Details Name: Rita Douglas MRN: [id number] DOB: [birth year] Today's Date: 08/29/2020   History of Present Illness  Rita Douglas is an 86 y.o. female with a history of DM, HTN, breast cancer and Alzheimer's dementia who initially presented to [facility] with a 2 day history of left facial droop and LLE weakness manifesting as dragging of her left leg while ambulating. Exam by the EDP revealed slight drift of her LUE, mild dysmetria of her LUE and LLE and slight facial asymmetry. She was transferred to the [facility] ED for an MRI, which revealed an acute right paramedian pontine ischemic infarction.  Clinical Impression  Patient received in bed, agrees to PT session. States she is ready to go home. No pain reported. She performs bed mobility with mod independence. LE strength is grossly normal and equal bilaterally. She performed sit to stand with min assist. Ambulated a total of 125-150 feet with hand held assist and using RW for portion of walk. She is easily distracted and had difficulty steering RW while in hallway. Unsteady with ambulating initially, but improved with increased distance. She will continue to benefit from skilled PT while here to improve functional mobility and independence to return safely home with daughter.     Follow Up Recommendations Home health PT    Equipment Recommendations  Rolling walker with 5" wheels    Recommendations for Other Services       Precautions / Restrictions Precautions Precautions: Fall Restrictions Weight Bearing Restrictions: No      Mobility  Bed Mobility Overal bed mobility: Modified Independent             General bed mobility comments: no physical assist needed for bed mobility  Transfers Overall transfer level: Needs assistance Equipment used: 1 person hand held assist Transfers: Sit to/from Stand Sit to Stand: Min assist            Ambulation/Gait Ambulation/Gait assistance: Min assist Gait  Distance (Feet): 150 Feet Assistive device: Rolling walker (2 wheeled);1 person hand held assist Gait Pattern/deviations: Step-through pattern;Shuffle;Decreased step length - right;Decreased step length - left;Decreased stride length;Trunk flexed Gait velocity: decreased   General Gait Details: patient requires cues for upright posture. Ambulated initially with hha 10 feet, unsteady, so continued ambulation with RW. Patient having difficulty steering RW, therefore went back to hha and patient is able to ambulate with min assist, but would not be safe to ambulate independently as she claims she did before.  Stairs            Wheelchair Mobility    Modified Rankin (Stroke Patients Only) Modified Rankin (Stroke Patients Only) Pre-Morbid Rankin Score: No significant disability Modified Rankin: Moderately severe disability     Balance Overall balance assessment: Needs assistance Sitting-balance support: Feet supported Sitting balance-Leahy Scale: Fair     Standing balance support: Single extremity supported;During functional activity Standing balance-Leahy Scale: Fair Standing balance comment: requires at least single hand held assist                             Pertinent Vitals/Pain Pain Assessment: No/denies pain    Home Living Family/patient expects to be discharged to:: Private residence Living Arrangements: Children Available Help at Discharge: Family;Available 24 hours/day                  Prior Function Level of Independence: Independent         Comments: reports she did not use AD,  but unsure how accurate information is.     Hand Dominance        Extremity/Trunk Assessment   Upper Extremity Assessment Upper Extremity Assessment: Defer to OT evaluation    Lower Extremity Assessment Lower Extremity Assessment: Generalized weakness    Cervical / Trunk Assessment Cervical / Trunk Assessment: Normal  Communication   Communication:  No difficulties  Cognition Arousal/Alertness: Awake/alert Behavior During Therapy: WFL for tasks assessed/performed Overall Cognitive Status: History of cognitive impairments - at baseline                                        General Comments      Exercises     Assessment/Plan    PT Assessment Patient needs continued PT services  PT Problem List Decreased strength;Decreased mobility;Decreased safety awareness;Decreased activity tolerance;Decreased balance;Decreased knowledge of use of DME;Decreased cognition;Decreased coordination;Decreased knowledge of precautions       PT Treatment Interventions DME instruction;Therapeutic activities;Gait training;Therapeutic exercise;Functional mobility training;Balance training;Neuromuscular re-education;Patient/family education    PT Goals (Current goals can be found in the Care Plan section)  Acute Rehab PT Goals Patient Stated Goal: to go home PT Goal Formulation: With patient Time For Goal Achievement: 09/05/20 Potential to Achieve Goals: Good    Frequency Min 4X/week   Barriers to discharge Inaccessible home environment;Decreased caregiver support      Co-evaluation               AM-PAC PT "6 Clicks" Mobility  Outcome Measure Help needed turning from your back to your side while in a flat bed without using bedrails?: None Help needed moving from lying on your back to sitting on the side of a flat bed without using bedrails?: None Help needed moving to and from a bed to a chair (including a wheelchair)?: A Little Help needed standing up from a chair using your arms (e.g., wheelchair or bedside chair)?: A Little Help needed to walk in hospital room?: A Little Help needed climbing 3-5 steps with a railing? : A Little 6 Click Score: 20    End of Session Equipment Utilized During Treatment: Gait belt Activity Tolerance: Patient tolerated treatment well Patient left: with call bell/phone within reach;with  bed alarm set;in bed Nurse Communication: Mobility status PT Visit Diagnosis: Difficulty in walking, not elsewhere classified (R26.2);Other abnormalities of gait and mobility (R26.89);History of falling (Z91.81);Unsteadiness on feet (R26.81);Muscle weakness (generalized) (M62.81)    Time: 1325-1340 PT Time Calculation (min) (ACUTE ONLY): 15 min   Charges:   PT Evaluation $PT Eval Moderate Complexity: 1 Mod          Brande Uncapher, PT, GCS 08/29/20,2:07 PM

## 2020-08-29 NOTE — Progress Notes (Addendum)
Inpatient Diabetes Program Recommendations  AACE/ADA: New Consensus Statement on Inpatient Glycemic Control (2015)  Target Ranges:  Prepandial:   less than 140 mg/dL      Peak postprandial:   less than 180 mg/dL (1-2 hours)      Critically ill patients:  140 - 180 mg/dL   Lab Results  Component Value Date   GLUCAP 138 (H) 08/29/2020   HGBA1C 8.6 (H) 08/28/2020    Review of Glycemic Control Results for Rita Douglas, Rita Douglas (MRN [id number]) as of 08/29/2020 11:03  Ref. Range 08/28/2020 08:14 08/28/2020 12:39 08/28/2020 16:11 08/28/2020 21:05 08/28/2020 23:32 08/29/2020 03:44 08/29/2020 05:35 08/29/2020 07:33  Glucose-Capillary Latest Ref Range: 70 - 99 mg/dL 134 (H) 147 (H) 171 (H) 171 (H) 185 (H) 69 (L) 123 (H) 138 (H)   Diabetes history: DM 2 Outpatient Diabetes medications: metformin 1000 mg bid Current orders for Inpatient glycemic control:  Novolog 0-9 units Q4 hours  Inpatient Diabetes Program Recommendations:    Mild hypoglycemia this am on Q4 Correction on Diet.  -  Consider changing frequency of Novolog correction scale to tid and add HS scale.  Thanks,  Tama Headings RN, MSN, BC-ADM Inpatient Diabetes Coordinator Team Pager [phone] (8a-5p)

## 2020-08-29 NOTE — TOC Transition Note (Signed)
Transition of Care [facility]) - CM/SW Discharge Note   Patient Details  Name: Rita Douglas MRN: [id number] Date of Birth: [birth year]  [facility] CM/SW Contact:  Pollie Friar, RN Phone Number: 08/29/2020, 2:41 PM   Clinical Narrative:    Pt discharging home with [facility] services through [place]. Cory with [facility] accepted the referral.  [employer] to deliver a walker to the room.  Pt states her family can provide the needed supervision at home.  Family to provide transport home.    Final next level of care: Home w Home Health Services Barriers to Discharge: No Barriers Identified   Patient Goals and CMS Choice   CMS Medicare.gov Compare Post Acute Care list provided to:: Patient Choice offered to / list presented to : Patient  Discharge Placement                       Discharge Plan and Services                DME Arranged: Walker rolling DME Agency: [employer] Date DME Agency Contacted: 08/29/20   Representative spoke with at DME Agency: Hilda Blades HH Arranged: PT, OT [facility] Agency: [place] Date [place]: 08/29/20   Representative spoke with at [place]: [place] ([place]) Interventions     Readmission Risk Interventions No flowsheet data found.

## 2020-09-26 ENCOUNTER — Inpatient Hospital Stay: Payer: Medicare HMO | Admitting: Adult Health

## 2020-09-26 ENCOUNTER — Encounter: Payer: Self-pay | Admitting: Adult Health

## 2020-11-07 DEATH — deceased

## 2021-11-23 IMAGING — DX DG CHEST 2V
2 series · 2 of 2 positions shown · non-contrast
Comparison: September 02, 2016

CLINICAL DATA: Facial droop.

EXAM:
CHEST - 2 VIEW

[chest lat]
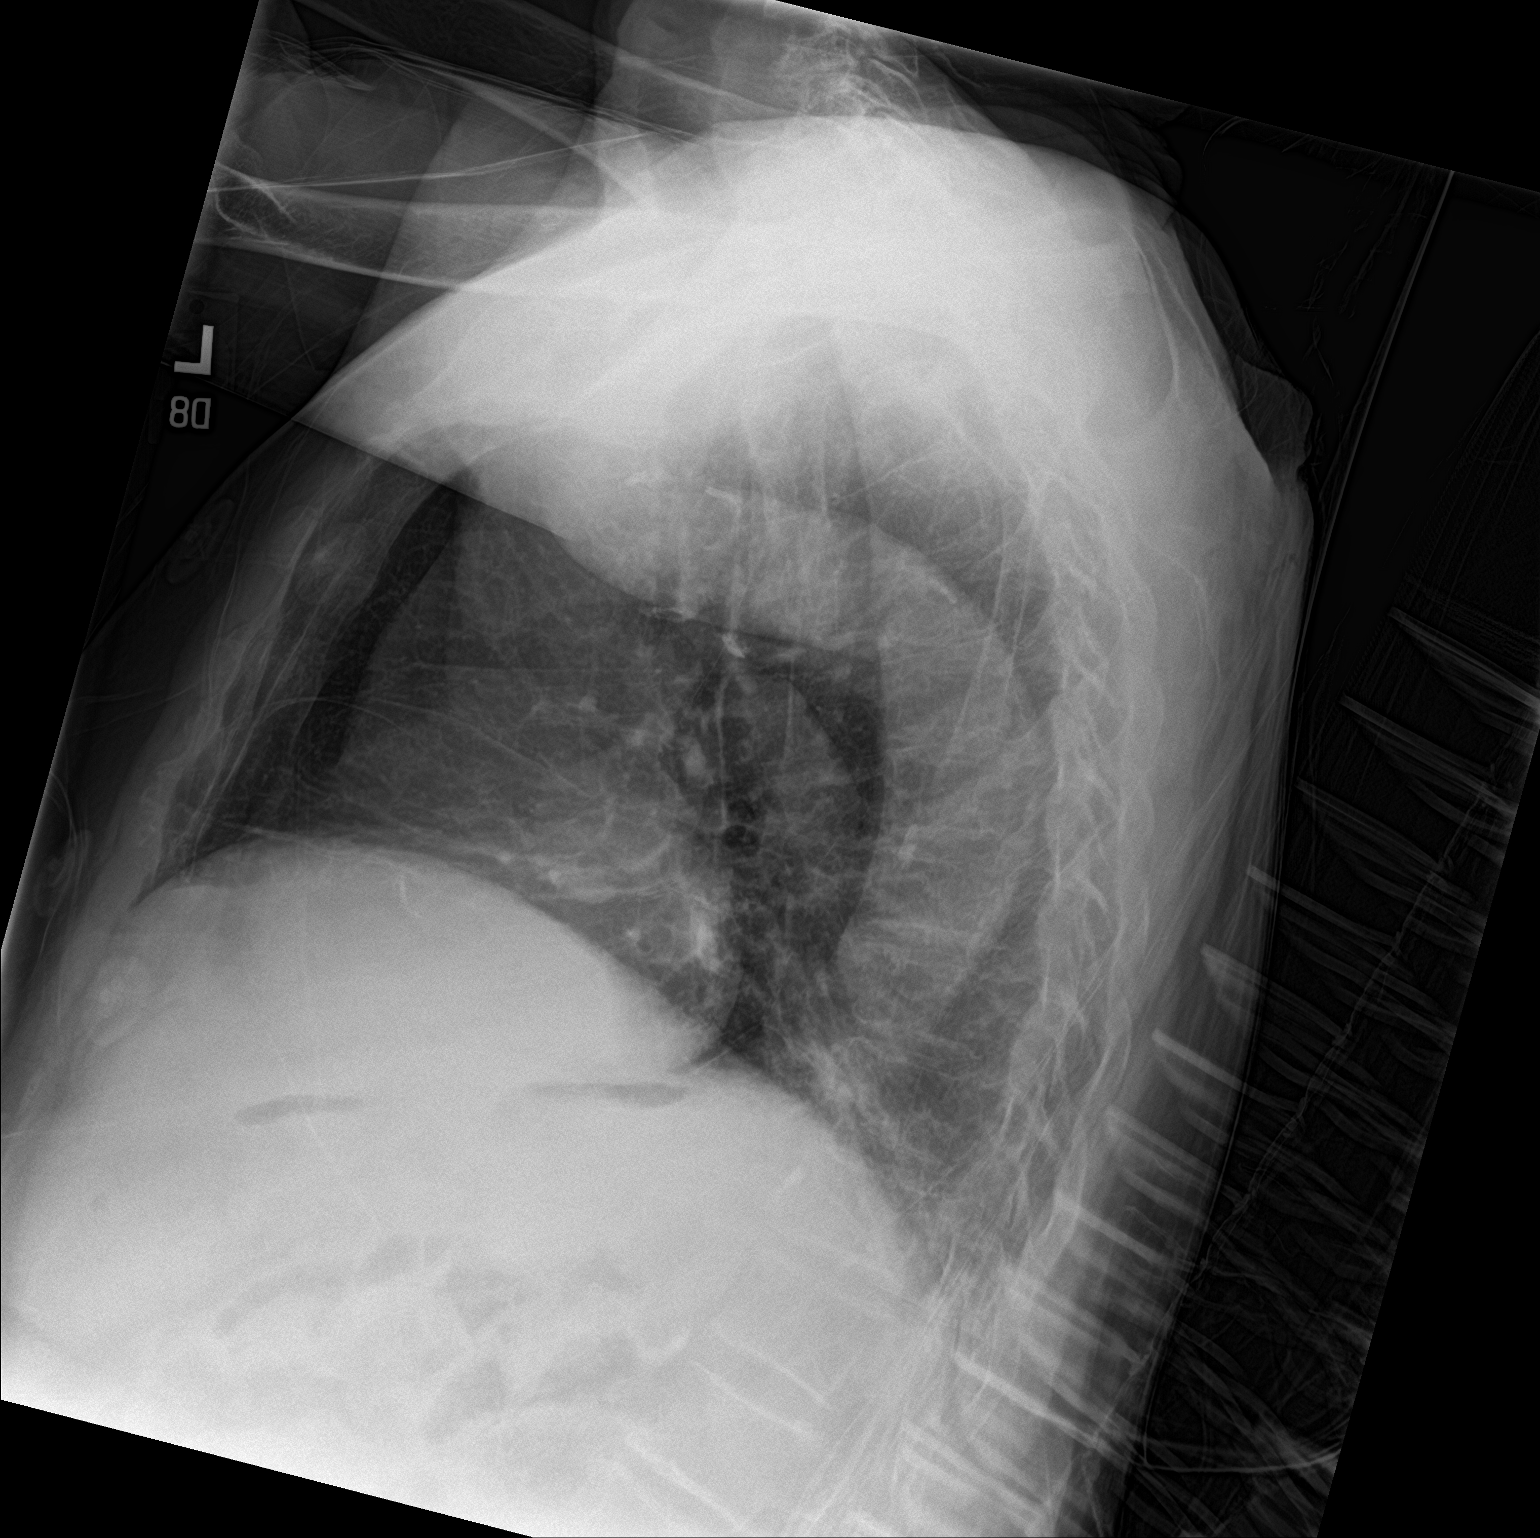

[chest ap]
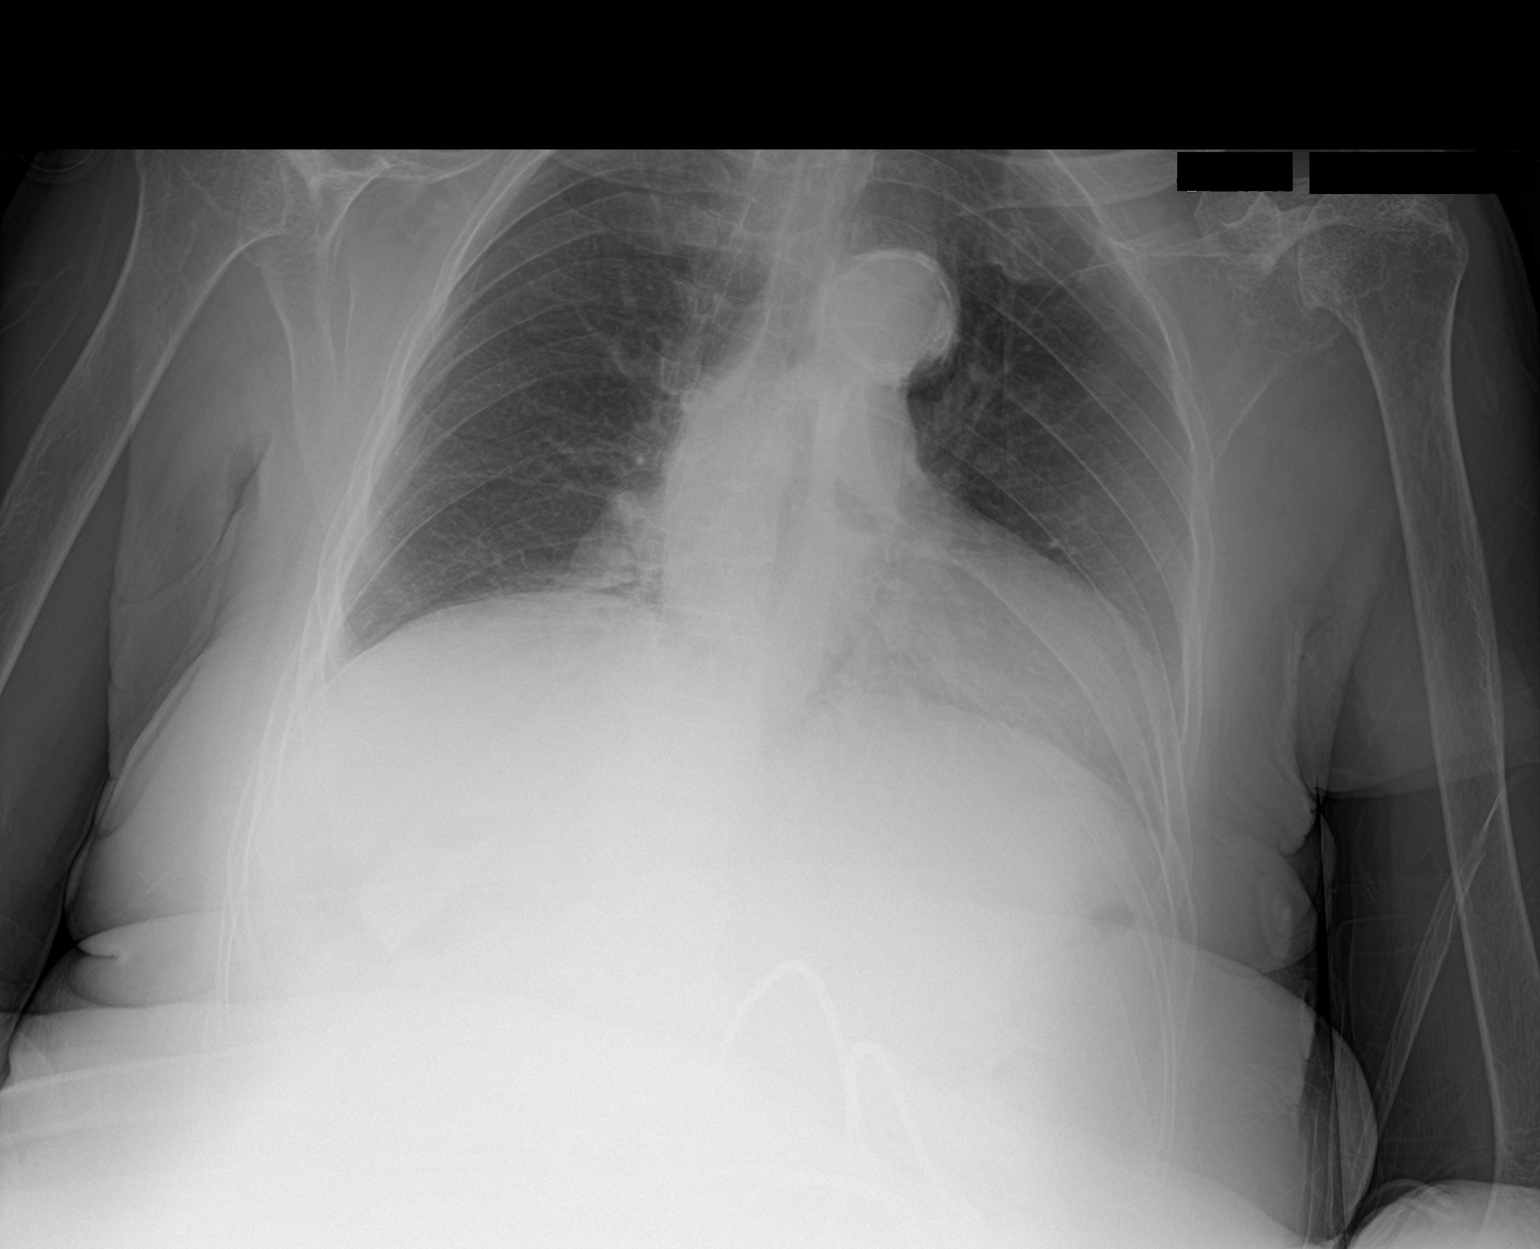

[2 of 2 positions shown; findings below may reference images not displayed]

FINDINGS: Mildly decreased lung volumes are seen which is likely secondary to
the degree of patient inspiration. There is no evidence of acute
infiltrate, pleural effusion or pneumothorax. There is mild to
moderate severity enlargement of the cardiac silhouette. Marked
severity calcification of the aortic arch is seen. Moderate severity
degenerative changes seen involving the left shoulder.
IMPRESSION: No acute or active cardiopulmonary disease.

## 2021-11-24 IMAGING — CT CT ANGIO HEAD
3 of 11 series · 9 of 34 positions shown · IV contrast (omnipaque)
Comparison: Head CT and brain MRI 08/27/2020.

CLINICAL DATA: 86-year-old female with left side symptoms, right
pontine infarct on MRI yesterday.

EXAM:
CT ANGIOGRAPHY HEAD AND NECK
TECHNIQUE: Multidetector CT imaging of the head and neck was performed using
the standard protocol during bolus administration of intravenous
contrast. Multiplanar CT image reconstructions and MIPs were
obtained to evaluate the vascular anatomy. Carotid stenosis
measurements (when applicable) are obtained utilizing NASCET
criteria, using the distal internal carotid diameter as the
denominator.
CONTRAST:  50mL OMNIPAQUE IOHEXOL 350 MG/ML SOLN

[Series 8: sag soft · sagittal · 0.31mm/px · 1 of 58 slices shown]
[im 7/58  soft-tissue]
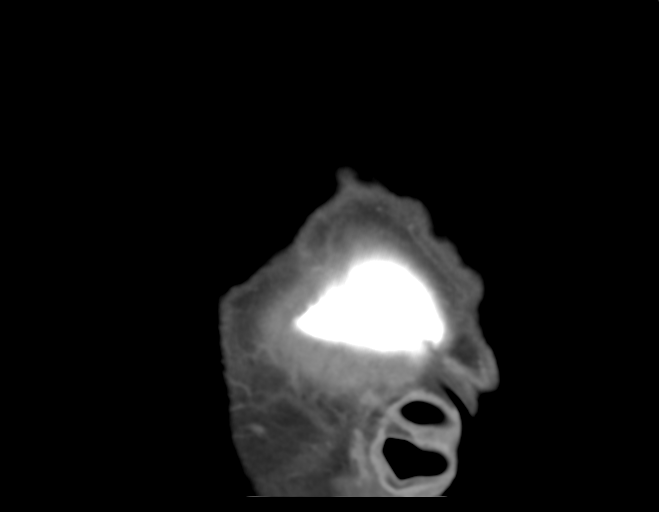

[Series 9: cta neck/head · axial · 0.43mm/px · z∈[-275,+45]mm · 3 of 161 slices shown]
[im 1/161  soft-tissue]
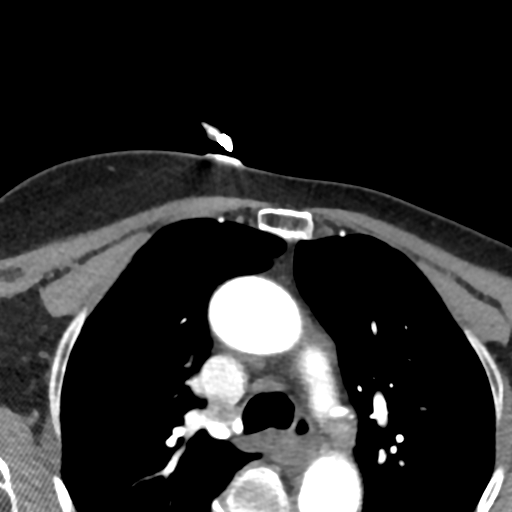
[im 81/161  bone]
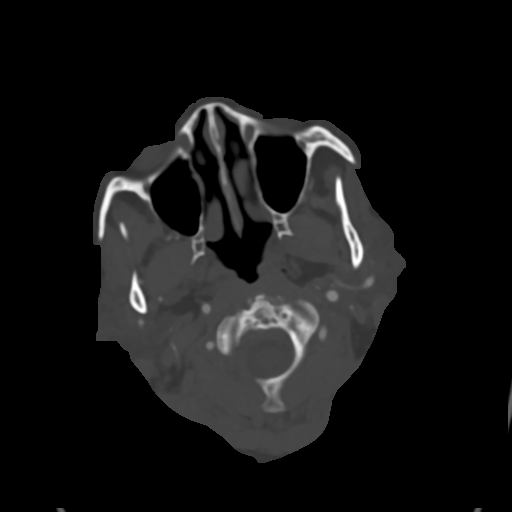
[im 161/161  soft-tissue]
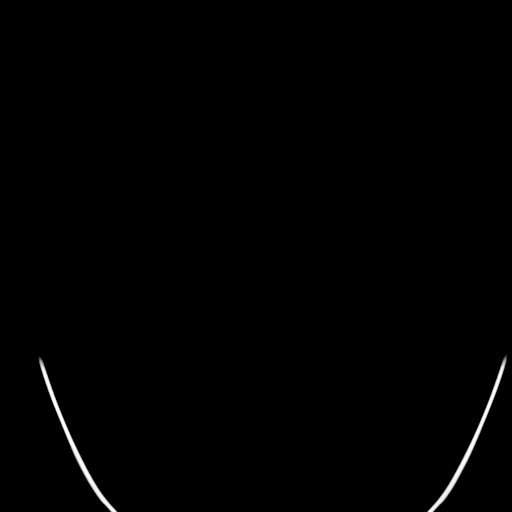

[Series 11: ax thins · axial · 0.39mm/px · z∈[-259,-66]mm · 5 of 319 slices shown]
[im 54/319  soft-tissue]
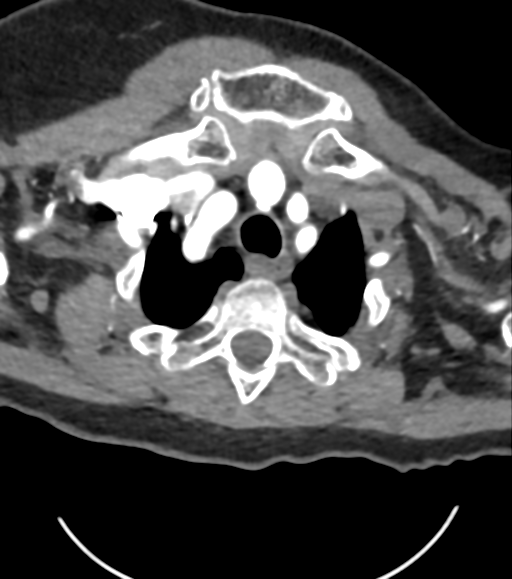
[im 107/319  soft-tissue]
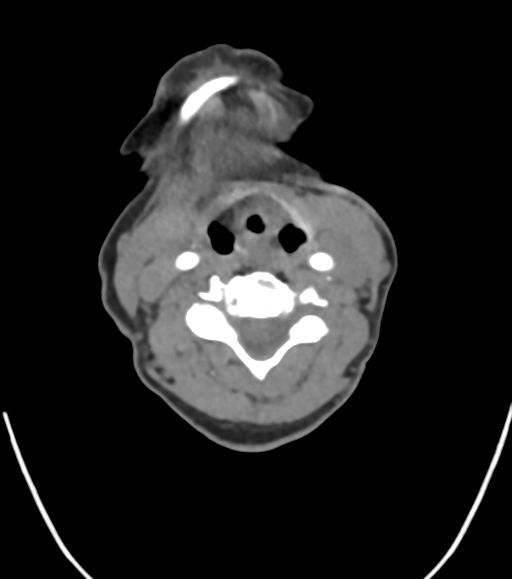
[im 160/319  soft-tissue]
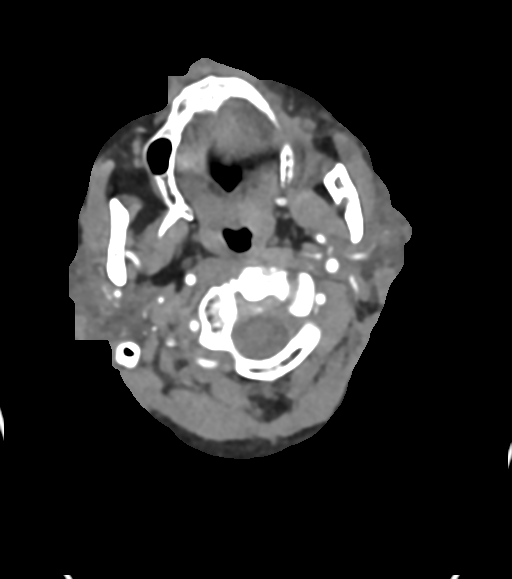
[im 213/319  soft-tissue]
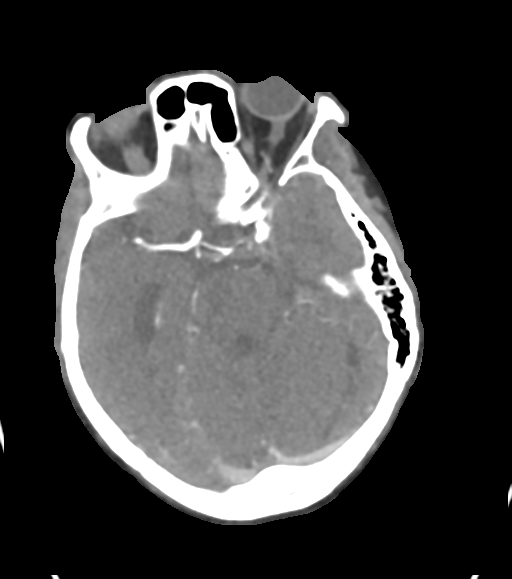
[im 266/319  soft-tissue]
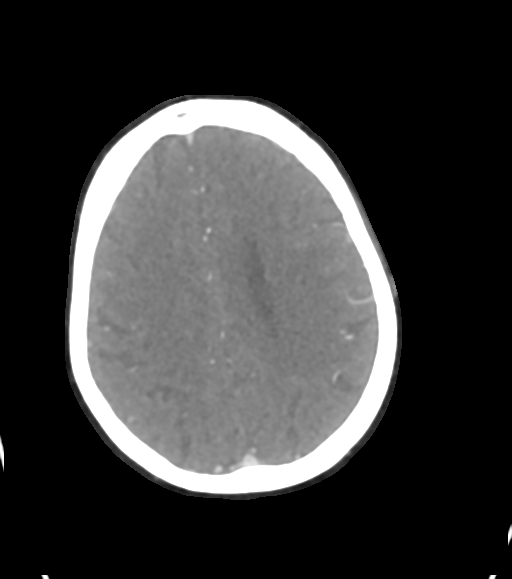

[9 of 34 positions shown; findings below may reference images not displayed]

FINDINGS: CT HEAD

Brain: Calcified atherosclerosis at the skull base. Mild hypodensity
in the right pons corresponding to the acute infarct by MRI on
series 5, image 10. No associated hemorrhage or mass effect.

Elsewhere gray-white matter differentiation in the brain is stable
and normal for age. No midline shift, mass effect, or evidence of
intracranial mass lesion. No acute intracranial hemorrhage
identified. No ventriculomegaly.

Calvarium and skull base: No acute osseous abnormality identified.

Paranasal sinuses: Visualized paranasal sinuses and mastoids are
stable and well pneumatized.

Orbits: No acute orbit or scalp soft tissue finding.

CTA NECK

Skeleton: Absent dentition. Widespread cervical spine degeneration
with multilevel spondylolisthesis. No acute osseous abnormality
identified.

Upper chest: Negative.

Other neck: Mild motion artifact in the pharynx.  No acute findings.

Aortic arch: Mild ectasia of the aortic arch. Calcified aortic
atherosclerosis. 3 vessel arch configuration.

Right carotid system: Brachiocephalic artery plaque without
stenosis. Tortuous proximal right CCA. Negative right carotid
bifurcation. Negative cervical right ICA aside from tortuosity.

Left carotid system: Negative but otherwise tortuous left CCA.
Calcified plaque at the left ICA origin and bulb with less than 50 %
stenosis with respect to the distal vessel. Tortuous left ICA distal
to the bulb without additional plaque.

Vertebral arteries:
Proximal right subclavian artery plaque without stenosis. Normal
right vertebral artery origin. Tortuous but otherwise negative
cervical right vertebral artery.

Proximal left subclavian artery soft and calcified plaque with
stenosis up to 55-60 % with respect to the distal vessel (series 12,
image 80). Partially calcified origin of the left vertebral artery
is short distance distal to the stenosis, with moderate vertebral
origin stenosis. Tortuous but otherwise negative left V2 segment.
Minimal calcified plaque in the left V3 segment without stenosis.

CTA HEAD

Posterior circulation: Fairly codominant distal vertebral arteries
are patent without plaque or stenosis. Normal PICA origins. Patent
vertebrobasilar junction. The proximal basilar artery is within
normal limits. But there is a high-grade mid basilar stenosis best
seen on series 16, image 21, with short segment radiographic string
sign. The distal basilar is patent. SCA and PCA origins are patent
although irregular. There is multifocal moderate to severe stenosis
of the left PCA P1 and P2 segments (series 16 image 20). Preserved
distal left PCA enhancement. Multifocal mild to moderate stenosis of
the right PCA (series 16, image 17) with preserved distal
enhancement. Posterior communicating arteries are diminutive or
absent.

Anterior circulation: Right ICA siphon is patent with moderate
cavernous segment plaque and stenosis (series 13, image 75), plus
moderate to severe proximal supraclinoid segment stenosis beginning
at the anterior genu (series 12, image 77). The right ICA terminus
remains patent.

The left ICA siphon is larger, appears dominant and is patent. The
left siphon demonstrates combined ectasia and multifocal calcified
plaque with moderate cavernous segment stenosis (series 13, image
107 and series 11, image 113) and mild to moderate. The left ICA
terminus is patent.

Patent MCA and ACA origins. The left A1 is dominant, the right A1 is
diminutive or absent. Anterior communicating artery and bilateral
ACA branches are within normal limits. Left MCA M1 segment
bifurcates early without stenosis. But there is moderate stenosis of
the superior left M2 division which remains patent. See series 15,
image 16. Other left MCA branches are within normal limits.

Right MCA M1 segment and bifurcation are patent without stenosis.
Right MCA branches are within normal limits.

Venous sinuses: Early contrast timing. The superior sagittal sinus
is patent.

Anatomic variants: Dominant left and diminutive or absent right ACA
A1 segments.

Review of the MIP images confirms the above findings
IMPRESSION: 1. Positive for Severe Intracranial Atherosclerosis with significant
stenoses:
- Severe mid Basilar stenosis, possibly related to the acute pontine
infarct.
- moderate to severe Right ICA supraclinoid stenosis, moderate
bilateral cavernous ICA stenoses.
- moderate to severe left and moderate right PCA stenoses.
- moderate stenosis of a Left MCA M2.

2. No significant cervical carotid stenosis in the neck.
Up to 60% stenosis of the proximal Left Subclavian Artery and
Moderate stenosis at the origin of the Left Vertebral Artery.

3. Expected CT appearance of the small right pontine infarct, with
no hemorrhage or mass effect. No new intracranial abnormality.

4. Aortic Atherosclerosis ([id number]).
